# Patient Record
Sex: Female | Born: 1990 | Hispanic: Yes | Marital: Married | Smoking: Never smoker
Health system: Southern US, Community
[De-identification: ages and names within clinical notes are randomized; demographics above are authoritative.]

## PROBLEM LIST (undated history)

## (undated) DIAGNOSIS — T7840XA Allergy, unspecified, initial encounter: Secondary | ICD-10-CM

## (undated) DIAGNOSIS — D649 Anemia, unspecified: Secondary | ICD-10-CM

## (undated) HISTORY — DX: Anemia, unspecified: D64.9

## (undated) HISTORY — PX: NO PAST SURGERIES: SHX2092

## (undated) HISTORY — DX: Allergy, unspecified, initial encounter: T78.40XA

## (undated) HISTORY — PX: OTHER SURGICAL HISTORY: SHX169

---
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
## 2012-04-23 ENCOUNTER — Emergency Department: Payer: Self-pay | Admitting: Emergency Medicine

## 2012-09-06 ENCOUNTER — Emergency Department: Payer: Self-pay | Admitting: Emergency Medicine

## 2012-09-09 LAB — BETA STREP CULTURE(ARMC)

## 2013-01-25 ENCOUNTER — Emergency Department: Payer: Self-pay | Admitting: Emergency Medicine

## 2013-01-25 LAB — URINALYSIS, COMPLETE
Bacteria: NONE SEEN
Bilirubin,UR: NEGATIVE
Blood: NEGATIVE
Ketone: NEGATIVE
Nitrite: NEGATIVE
Ph: 6 (ref 4.5–8.0)
Specific Gravity: 1.005 (ref 1.003–1.030)
Squamous Epithelial: 4
WBC UR: 9 /HPF (ref 0–5)

## 2013-01-25 LAB — CBC
HCT: 35.6 % (ref 35.0–47.0)
MCH: 26 pg (ref 26.0–34.0)
MCHC: 32.4 g/dL (ref 32.0–36.0)
Platelet: 252 10*3/uL (ref 150–440)
RBC: 4.45 10*6/uL (ref 3.80–5.20)
RDW: 15 % — ABNORMAL HIGH (ref 11.5–14.5)
WBC: 8.9 10*3/uL (ref 3.6–11.0)

## 2013-01-25 LAB — COMPREHENSIVE METABOLIC PANEL
Alkaline Phosphatase: 67 U/L (ref 50–136)
Anion Gap: 8 (ref 7–16)
Bilirubin,Total: 1.6 mg/dL — ABNORMAL HIGH (ref 0.2–1.0)
Calcium, Total: 9.5 mg/dL (ref 8.5–10.1)
EGFR (Non-African Amer.): >60
Glucose: 92 mg/dL (ref 65–99)
Osmolality: 270 (ref 275–301)
Potassium: 3.4 mmol/L — ABNORMAL LOW (ref 3.5–5.1)
SGPT (ALT): 16 U/L (ref 12–78)

## 2013-01-25 LAB — LIPASE, BLOOD: Lipase: 137 U/L (ref 73–393)

## 2013-09-01 ENCOUNTER — Emergency Department: Payer: Self-pay | Admitting: Emergency Medicine

## 2013-09-01 LAB — COMPREHENSIVE METABOLIC PANEL
ALT: 18 U/L (ref 12–78)
ANION GAP: 3 — AB (ref 7–16)
Albumin: 4 g/dL (ref 3.4–5.0)
Alkaline Phosphatase: 75 U/L
BUN: 17 mg/dL (ref 7–18)
Bilirubin,Total: 0.7 mg/dL (ref 0.2–1.0)
CO2: 29 mmol/L (ref 21–32)
CREATININE: 0.63 mg/dL (ref 0.60–1.30)
Calcium, Total: 8.7 mg/dL (ref 8.5–10.1)
Chloride: 107 mmol/L (ref 98–107)
EGFR (African American): >60
EGFR (Non-African Amer.): >60
Glucose: 97 mg/dL (ref 65–99)
Osmolality: 279 (ref 275–301)
Potassium: 3.8 mmol/L (ref 3.5–5.1)
SGOT(AST): 18 U/L (ref 15–37)
Sodium: 139 mmol/L (ref 136–145)
TOTAL PROTEIN: 8.6 g/dL — AB (ref 6.4–8.2)

## 2013-09-01 LAB — URINALYSIS, COMPLETE
BACTERIA: NONE SEEN
BLOOD: NEGATIVE
Bilirubin,UR: NEGATIVE
Glucose,UR: NEGATIVE mg/dL (ref 0–75)
KETONE: NEGATIVE
NITRITE: NEGATIVE
PH: 6 (ref 4.5–8.0)
Protein: NEGATIVE
RBC, UR: NONE SEEN /HPF (ref 0–5)
Specific Gravity: 1.009 (ref 1.003–1.030)
Squamous Epithelial: 1
WBC UR: 1 /HPF (ref 0–5)

## 2013-09-01 LAB — CBC WITH DIFFERENTIAL/PLATELET
BASOS ABS: 0 10*3/uL (ref 0.0–0.1)
BASOS PCT: 0.8 %
Eosinophil #: 0.1 10*3/uL (ref 0.0–0.7)
Eosinophil %: 2.1 %
HCT: 34.4 % — ABNORMAL LOW (ref 35.0–47.0)
HGB: 10.8 g/dL — ABNORMAL LOW (ref 12.0–16.0)
LYMPHS ABS: 2.8 10*3/uL (ref 1.0–3.6)
Lymphocyte %: 50.8 %
MCH: 25.6 pg — AB (ref 26.0–34.0)
MCHC: 31.3 g/dL — ABNORMAL LOW (ref 32.0–36.0)
MCV: 82 fL (ref 80–100)
Monocyte #: 0.5 x10 3/mm (ref 0.2–0.9)
Monocyte %: 8.3 %
Neutrophil #: 2.1 10*3/uL (ref 1.4–6.5)
Neutrophil %: 38 %
Platelet: 199 10*3/uL (ref 150–440)
RBC: 4.21 10*6/uL (ref 3.80–5.20)
RDW: 15.4 % — ABNORMAL HIGH (ref 11.5–14.5)
WBC: 5.5 10*3/uL (ref 3.6–11.0)

## 2013-09-01 LAB — LIPASE, BLOOD: LIPASE: 207 U/L (ref 73–393)

## 2013-09-02 LAB — GC/CHLAMYDIA PROBE AMP

## 2013-09-02 LAB — WET PREP, GENITAL

## 2013-09-07 ENCOUNTER — Emergency Department: Payer: Self-pay | Admitting: Emergency Medicine

## 2014-01-29 IMAGING — US US PELV - US TRANSVAGINAL
1 series · 14 of 25 positions shown · non-contrast
Comparison: none

REASON FOR EXAM: concern for mass
COMMENTS:

[Series 1: us pelv - us transvaginal · 0.21mm/px · 14 of 45 slices shown]
[im 1/45]
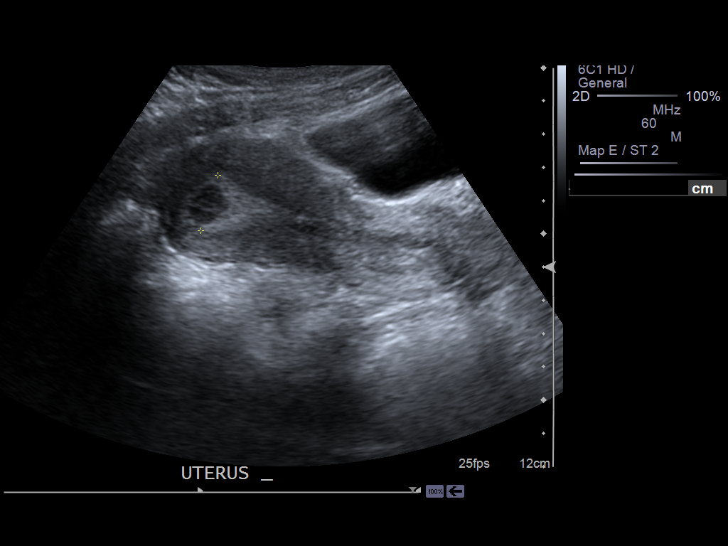
[im 4/45]
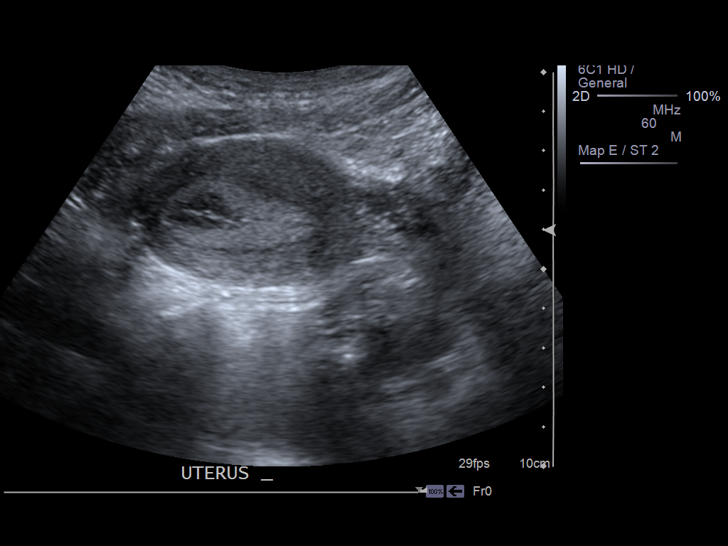
[im 8/45]
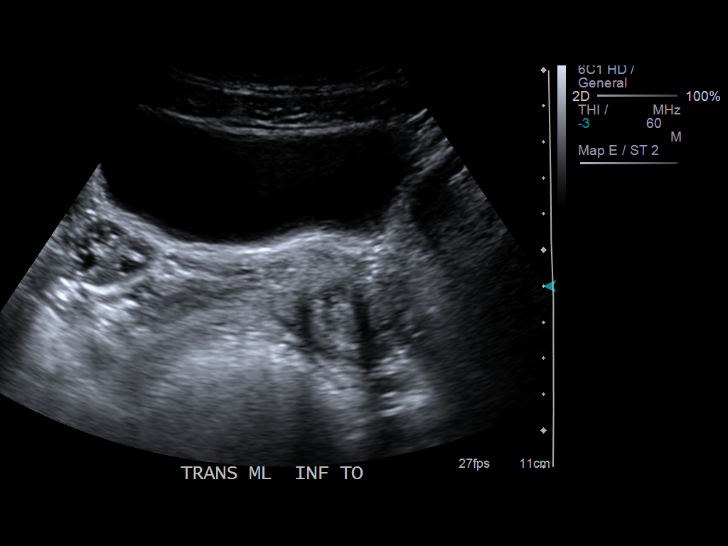
[im 12/45]
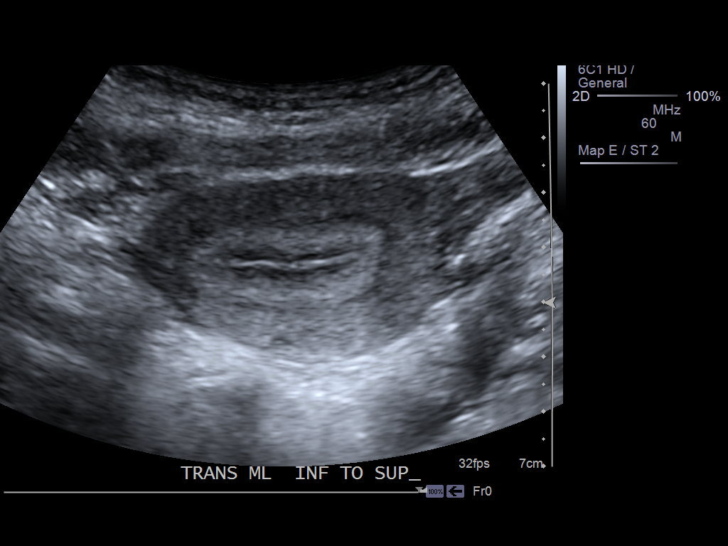
[im 15/45]
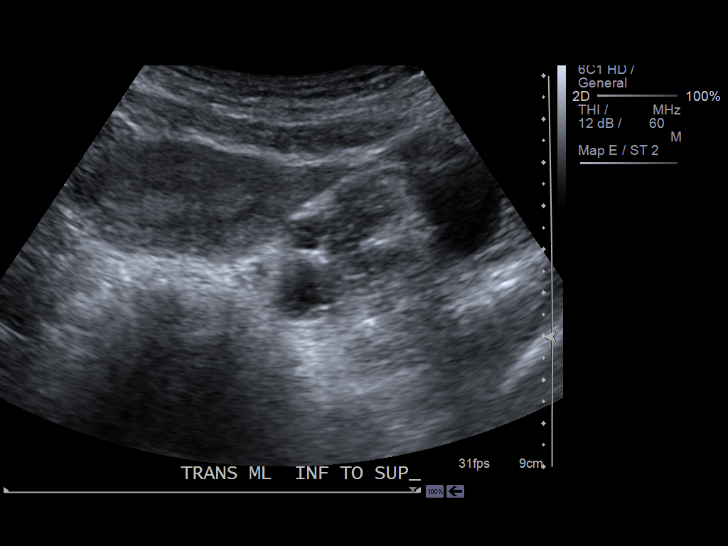
[im 17/45]
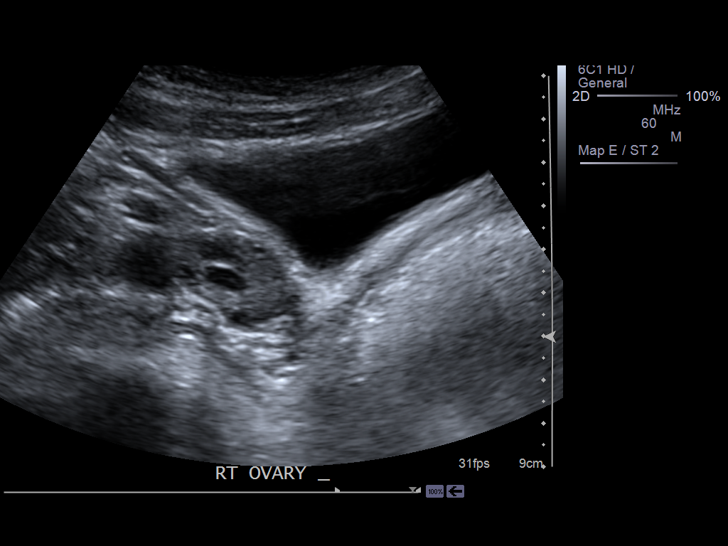
[im 21/45]
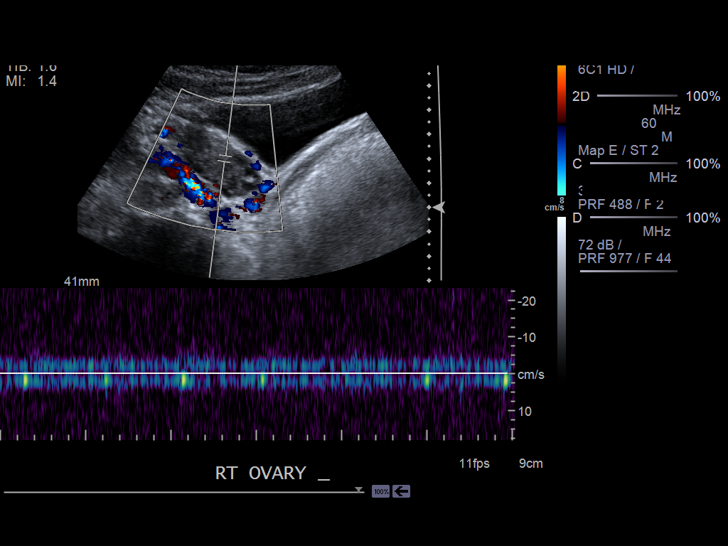
[im 24/45]
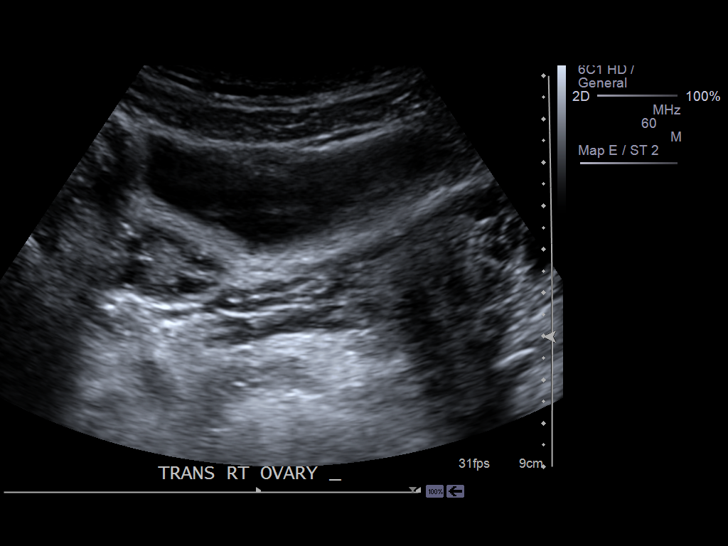
[im 28/45]
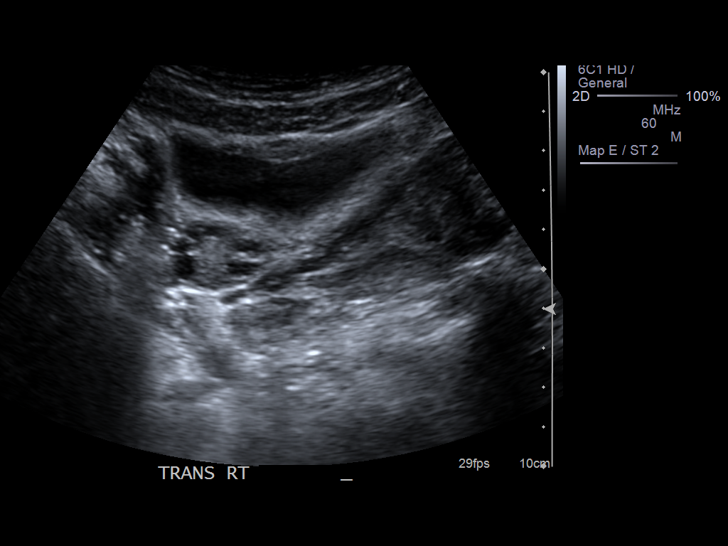
[im 30/45]
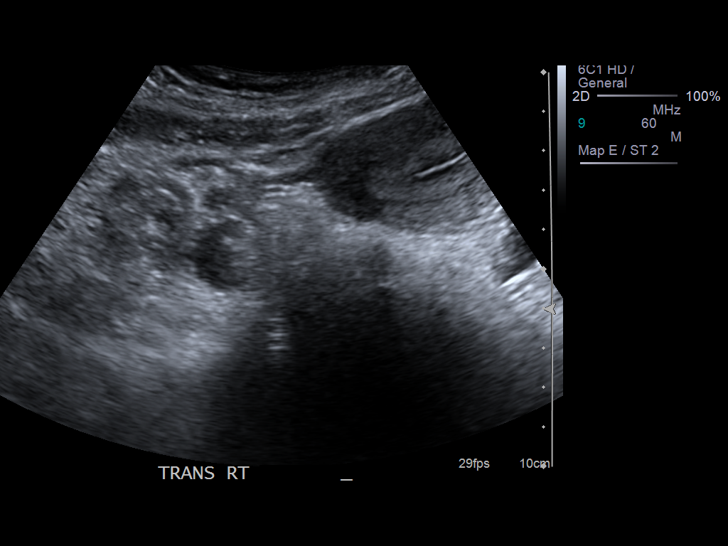
[im 34/45]
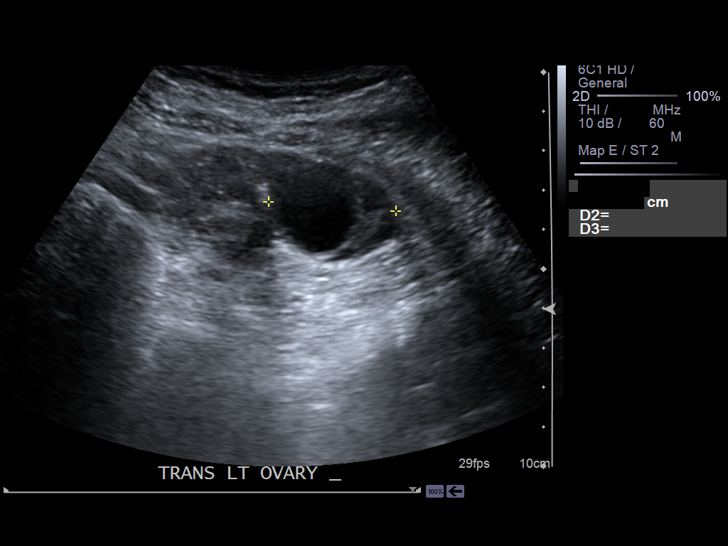
[im 37/45]
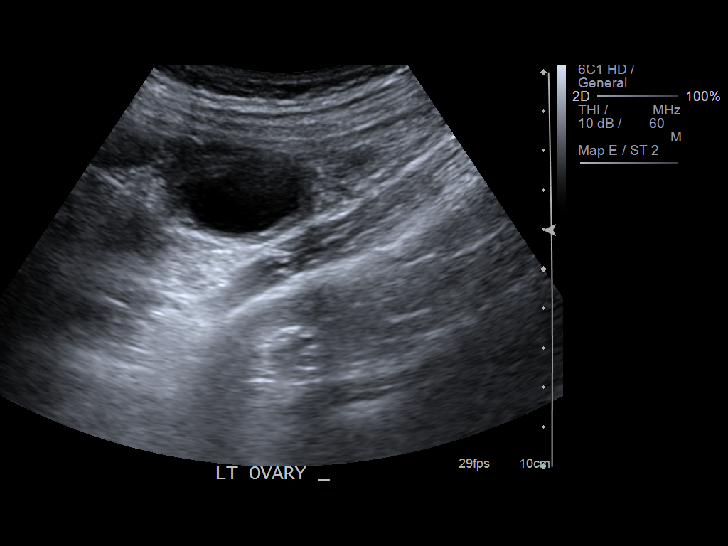
[im 41/45]
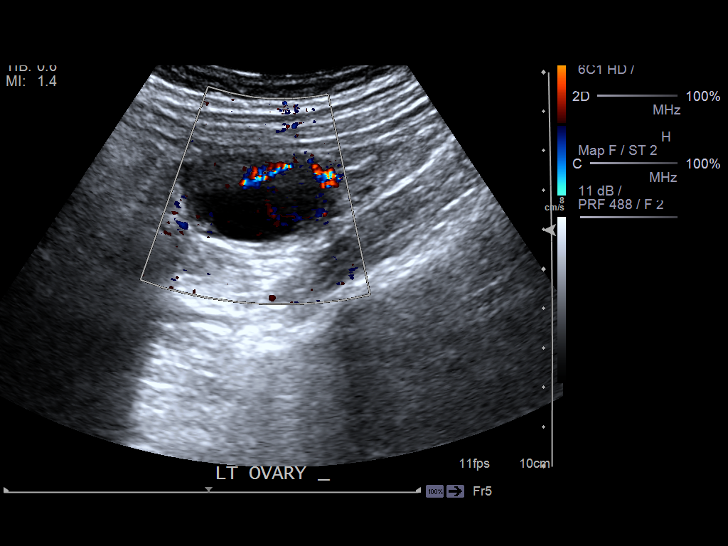
[im 45/45]
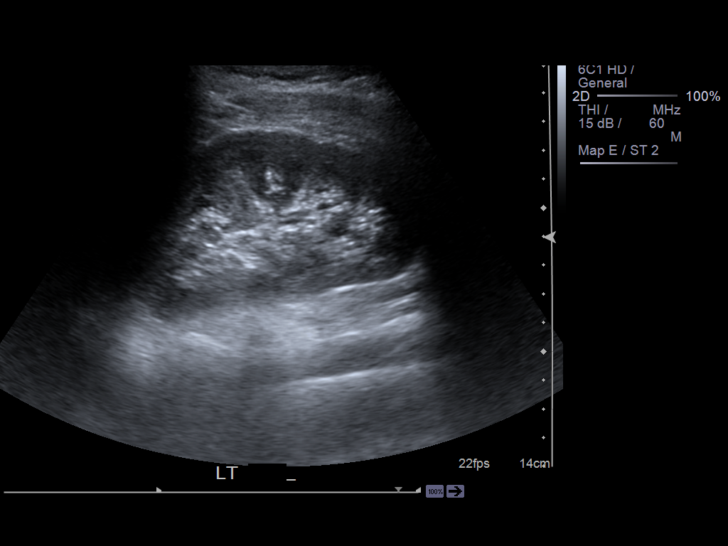

[14 of 25 positions shown; findings below may reference images not displayed]

PROCEDURE:     US  - US PELVIS EXAM W/TRANSVAGINAL  - January 26, 2013  [DATE]

RESULT:     Grayscale and color flow Doppler techniques were employed to
evaluate the pelvic structures.

The uterus is normal in contour and measures 9.3 x 5.3 x 4 cm. The
endometrial stripe is thickened at 17.5 mm. The patient is about to begin
her menstrual cycle by history. There is no free fluid in the cul-de-sac.

The right ovary exhibits normal echotexture and vascularity and measures 3 x
1.9 x 1.6 cm. The left ovary measures 4 x 3.2 x 2.5 cm and contains a 3 x
1.9 x 2 cm diameter simple appearing cyst. Vascularity of the left ovary
appears normal. Survey views of the kidneys are normal.
IMPRESSION: 1. There is a simple appearing cyst associated with the left ovary. The
right ovary is normal in appearance.
2. The uterus is normal in appearance for age.
3. No adnexal mass is demonstrated.

A preliminary report was sent to the [HOSPITAL] the conclusion
of the study.

[REDACTED]

## 2014-03-15 DIAGNOSIS — L709 Acne, unspecified: Secondary | ICD-10-CM | POA: Insufficient documentation

## 2014-03-15 DIAGNOSIS — L219 Seborrheic dermatitis, unspecified: Secondary | ICD-10-CM | POA: Insufficient documentation

## 2016-06-05 NOTE — L&D Delivery Note (Signed)
      Delivery Note   Kristine King is a 26 y.o. G2P1001 at 2213w2d Estimated Date of Delivery: 02/14/17  PRE-OPERATIVE DIAGNOSIS:  1) 5913w2d pregnancy.   POST-OPERATIVE DIAGNOSIS:  1) 9313w2d pregnancy s/p Vaginal, Spontaneous Delivery   Delivery Type: Vaginal, Spontaneous Delivery    Delivery Clinician: Doreene BurkeHOMPSON, Derryl Uher   Delivery Anesthesia: None   Labor Complications:     Additional complications: none  ESTIMATED BLOOD LOSS: 300mL   FINDINGS:   1) female infant, Apgar scores of 8    at 1 minute and 9    at 5 minutes and a birthweight of    ounces.    2) Nuchal cord: No  SPECIMENS:   PLACENTA:   Appearance: Intact , 3 vessel cord, cord blood collected   Removal: Spontaneous      Disposition:    held per protocol then discarded  DISPOSITION:  Infant to left in stable condition in the delivery room, with L&D personnel and mother,  NARRATIVE SUMMARY: Labor course:  Ms. Kristine SensorMagdalena Buccieri is a G2P1001 at 2913w2d who presented for labor management.  She progressed well in labor without pitocin.  She received the appropriate anesthesia and proceeded to complete dilation. She evidenced good maternal expulsive effort during the second stage. She went on to deliver a viable female infant. The placenta delivered without problems and was noted to be complete. A perineal and vaginal examination was performed. Episiotomy/Lacerations:   none The patient tolerated this well.  Doreene Burkennie Emanuell Morina, CNM 02/02/2017 8:47 AM

## 2016-06-16 ENCOUNTER — Encounter: Payer: Self-pay | Admitting: Certified Nurse Midwife

## 2016-06-16 ENCOUNTER — Ambulatory Visit (INDEPENDENT_AMBULATORY_CARE_PROVIDER_SITE_OTHER): Payer: 59 | Admitting: Certified Nurse Midwife

## 2016-06-16 VITALS — BP 105/60 | HR 76 | Ht 62.0 in | Wt 136.1 lb

## 2016-06-16 DIAGNOSIS — Z3201 Encounter for pregnancy test, result positive: Secondary | ICD-10-CM

## 2016-06-16 DIAGNOSIS — N926 Irregular menstruation, unspecified: Secondary | ICD-10-CM | POA: Diagnosis not present

## 2016-06-16 LAB — POCT URINE PREGNANCY: Preg Test, Ur: POSITIVE — AB

## 2016-06-16 NOTE — Patient Instructions (Signed)

## 2016-06-16 NOTE — Progress Notes (Signed)
Pt is here for a confirmation of pregnancy. 3 positive home tests. Period in Nov was short and light. Oct was normal. Nausea,vomiting,fatigue and breast tenderness.

## 2016-06-16 NOTE — Progress Notes (Signed)
GYN ENCOUNTER NOTE  Subjective:       Kristine King is a 26 y.o. female is here for gynecologic evaluation of the following issues: missed menses and positive pregnancy test. Previously a patient of [facility].   [name] reports three (3) positive urine pregnancy tests on 05/09/2016.  Her menstrual periods occur monthly, but not at the same time each moth. Her last cycle after Thanksgiving was short and lighter than usual.  She endorses breast tenderness, nausea, and sensitivity to smells as well as itching and dryness to arms and abdomen.   She denies difficulty breathing or respiratory distress, chest pain, abdominal pain, vaginal bleeding, and leg pain or swelling.    Gynecologic History Patient's last menstrual period was 05/01/2016 (approximate).   EDD: 02/05/2017  Gestational age: 42 weeks 4 days  Contraception: none   Last Pap: 2011. Results were: normal  Obstetric History OB History  Gravida Para Term Preterm AB Living  2 1 1     1   SAB TAB Ectopic Multiple Live Births          1    # Outcome Date GA Lbr Len/2nd Weight Sex Delivery Anes PTL Lv  2 Current           1 Term 12/16/09   6 lb 10 oz (3.005 kg) F Vag-Spont  N LIV      Past Medical History:  Diagnosis Date  . Anemia     History reviewed. No pertinent surgical history.  No current outpatient prescriptions on file prior to visit.   No current facility-administered medications on file prior to visit.     Allergies  Allergen Reactions  . Meloxicam Swelling  . Penicillin G Hives    Social History   Social History  . Marital status: Married    Spouse name: N/A  . Number of children: N/A  . Years of education: N/A   Occupational History  . Not on file.   Social History Main Topics  . Smoking status: Never Smoker  . Smokeless tobacco: Never Used  . Alcohol use No  . Drug use: No  . Sexual activity: Yes    Birth control/ protection: None   Other Topics Concern  . Not on file    Social History Narrative  . No narrative on file    Family History  Problem Relation Age of Onset  . Diabetes Paternal Grandfather     The following portions of the patient's history were reviewed and updated as appropriate: allergies, current medications, past family history, past medical history, past social history, past surgical history and problem list.  Review of Systems  Review of Systems - Negative except as noted above  Objective:   BP 105/60   Pulse 76   Ht 5\' 2"  (1.575 m)   Wt 136 lb 2 oz (61.7 kg)   LMP 05/01/2016 (Approximate)   BMI 24.90 kg/m    CONSTITUTIONAL: Well-developed, well-nourished female in no acute distress.   HENT:  Normocephalic, atraumatic.   NECK: Normal range of motion, supple, no masses.  Normal thyroid.   SKIN: Skin is warm and dry. No rash noted. Not diaphoretic. No erythema. No pallor.  NEUROLGIC: Alert and oriented to person, place, and time.   PSYCHIATRIC: Normal mood and affect. Normal behavior. Normal judgment and thought content.  CARDIOVASCULAR: Regular rate and rhythm   RESPIRATORY: Clear bilateral  BREASTS: Not Examined  ABDOMEN: Soft, non distended; Non tender.  No Organomegaly.  PELVIC: Not examined.  MUSCULOSKELETAL: Normal range of motion. No tenderness.  No cyanosis, clubbing, or edema.  Labs: positive urine pregnancy test  Assessment:   Missed menses  Positive pregnancy test  Plan:   RTC x 1-2 weeks for viability [place] and nurse intake.  RTC sooner if needed.  Reviewed red flag symptoms and reasons to call.  Start taking PNV w/folic acid and DHA.    Gunnar Bulla, CNM

## 2016-06-30 ENCOUNTER — Ambulatory Visit (INDEPENDENT_AMBULATORY_CARE_PROVIDER_SITE_OTHER): Payer: 59

## 2016-06-30 ENCOUNTER — Ambulatory Visit (INDEPENDENT_AMBULATORY_CARE_PROVIDER_SITE_OTHER): Payer: 59 | Admitting: Certified Nurse Midwife

## 2016-06-30 VITALS — BP 101/58 | HR 66 | Ht 62.0 in | Wt 137.8 lb

## 2016-06-30 DIAGNOSIS — N926 Irregular menstruation, unspecified: Secondary | ICD-10-CM | POA: Diagnosis not present

## 2016-06-30 DIAGNOSIS — Z3481 Encounter for supervision of other normal pregnancy, first trimester: Secondary | ICD-10-CM

## 2016-06-30 DIAGNOSIS — Z1389 Encounter for screening for other disorder: Secondary | ICD-10-CM

## 2016-06-30 DIAGNOSIS — Z113 Encounter for screening for infections with a predominantly sexual mode of transmission: Secondary | ICD-10-CM

## 2016-06-30 DIAGNOSIS — Z3201 Encounter for pregnancy test, result positive: Secondary | ICD-10-CM | POA: Diagnosis not present

## 2016-06-30 NOTE — Patient Instructions (Signed)
Pregnancy and Zika Virus Disease Introduction Zika virus disease, or Zika, is an illness that can spread to people from mosquitoes that carry the virus. It may also spread from person to person through infected body fluids. Zika first occurred in [place], but recently it has spread to new areas. The virus occurs in tropical climates. The location of Zika continues to change. Most people who become infected with Zika virus do not develop serious illness. However, Zika may cause birth defects in an unborn baby whose mother is infected with the virus. It may also increase the risk of miscarriage. What are the symptoms of Zika virus disease? In many cases, people who have been infected with Zika virus do not develop any symptoms. If symptoms appear, they usually start about a week after the person is infected. Symptoms are usually mild. They may include:  Fever.  Rash.  Red eyes.  Joint pain. How does Zika virus disease spread? The main way that Zika virus spreads is through the bite of a certain type of mosquito. Unlike most types of mosquitos, which bite only at night, the type of mosquito that carries Zika virus bites both at night and during the day. Zika virus can also spread through sexual contact, through a blood transfusion, and from a mother to her baby before or during birth. Once you have had Zika virus disease, it is unlikely that you will get it again. Can I pass Zika to my baby during pregnancy? Yes, Zika can pass from a mother to her baby before or during birth. What problems can Zika cause for my baby? A woman who is infected with Zika virus while pregnant is at risk of having her baby born with a condition in which the brain or head is smaller than expected (microcephaly). Babies who have microcephaly can have developmental delays, seizures, hearing problems, and vision problems. Having Zika virus disease during pregnancy can also increase the risk of miscarriage. How can Zika  virus disease be prevented? There is no vaccine to prevent Zika. The best way to prevent the disease is to avoid infected mosquitoes and avoid exposure to body fluids that can spread the virus. Avoid any possible exposure to Zika by taking the following precautions. For women and their sex partners:  Avoid traveling to high-risk areas. The locations where Zika is being reported change often. To identify high-risk areas, check the CDC travel website: www.cdc.gov/zika/geo/index.html  If you or your sex partner must travel to a high-risk area, talk with a health care provider before and after traveling.  Take all precautions to avoid mosquito bites if you live in, or travel to, any of the high-risk areas. Insect repellents are safe to use during pregnancy.  Ask your health care provider when it is safe to have sexual contact. For women:  If you are pregnant or trying to become pregnant, avoid sexual contact with persons who may have been exposed to Zika virus, persons who have possible symptoms of Zika, or persons whose history you are unsure about. If you choose to have sexual contact with someone who may have been exposed to Zika virus, use condoms correctly during the entire duration of sexual activity, every time. Do not share sexual devices, as you may be exposed to body fluids.  Ask your health care provider about when it is safe to attempt pregnancy after a possible exposure to Zika virus. What steps should I take to avoid mosquito bites? Take these steps to avoid mosquito bites when you   are in a high-risk area:  Wear loose clothing that covers your arms and legs.  Limit your outdoor activities.  Do not open windows unless they have window screens.  Sleep under mosquito nets.  Use insect repellent. The best insect repellents have:  DEET, picaridin, oil of lemon eucalyptus (OLE), or IR3535 in them.  Higher amounts of an active ingredient in them.  Remember that insect repellents  are safe to use during pregnancy.  Do not use OLE on children who are younger than 3 years of age. Do not use insect repellent on babies who are younger than 2 months of age.  Cover your child's stroller with mosquito netting. Make sure the netting fits snugly and that any loose netting does not cover your child's mouth or nose. Do not use a blanket as a mosquito-protection cover.  Do not apply insect repellent underneath clothing.  If you are using sunscreen, apply the sunscreen before applying the insect repellent.  Treat clothing with permethrin. Do not apply permethrin directly to your skin. Follow label directions for safe use.  Get rid of standing water, where mosquitoes may reproduce. Standing water is often found in items such as buckets, bowls, animal food dishes, and flowerpots. When you return from traveling to any high-risk area, continue taking actions to protect yourself against mosquito bites for 3 weeks, even if you show no signs of illness. This will prevent spreading Zika virus to uninfected mosquitoes. What should I know about the sexual transmission of Zika? People can spread Zika to their sexual partners during vaginal, anal, or oral sex, or by sharing sexual devices. Many people with Zika do not develop symptoms, so a person could spread the disease without knowing that they are infected. The greatest risk is to women who are pregnant or who may become pregnant. Zika virus can live longer in semen than it can live in blood. Couples can prevent sexual transmission of the virus by:  Using condoms correctly during the entire duration of sexual activity, every time. This includes vaginal, anal, and oral sex.  Not sharing sexual devices. Sharing increases your risk of being exposed to body fluid from another person.  Avoiding all sexual activity until your health care provider says it is safe. Should I be tested for Zika virus? A sample of your blood can be tested for Zika  virus. A pregnant woman should be tested if she may have been exposed to the virus or if she has symptoms of Zika. She may also have additional tests done during her pregnancy, such ultrasound testing. Talk with your health care provider about which tests are recommended. This information is not intended to replace advice given to you by your health care provider. Make sure you discuss any questions you have with your health care provider. Document Released: 02/10/2015 Document Revised: 10/28/2015 Document Reviewed: 02/03/2015  2017 Elsevier Minor Illnesses and Medications in Pregnancy  Cold/Flu:  Sudafed for congestion- Robitussin (plain) for cough- Tylenol for discomfort.  Please follow the directions on the label.  Try not to take any more than needed.  OTC Saline nasal spray and air humidifier or cool-mist  Vaporizer to sooth nasal irritation and to loosen congestion.  It is also important to increase intake of non carbonated fluids, especially if you have a fever.  Constipation:  Colace-2 capsules at bedtime; Metamucil- follow directions on label; Senokot- 1 tablet at bedtime.  Any one of these medications can be used.  It is also very important to increase   fluids and fruits along with regular exercise.  If problem persists please call the office.  Diarrhea:  Kaopectate as directed on the label.  Eat a bland diet and increase fluids.  Avoid highly seasoned foods.  Headache:  Tylenol 1 or 2 tablets every 3-4 hours as needed  Indigestion:  Maalox, Mylanta, Tums or Rolaids- as directed on label.  Also try to eat small meals and avoid fatty, greasy or spicy foods.  Nausea with or without Vomiting:  Nausea in pregnancy is caused by increased levels of hormones in the body which influence the digestive system and cause irritation when stomach acids accumulate.  Symptoms usually subside after 1st trimester of pregnancy.  Try the following: 1. Keep saltines, graham crackers or dry toast by your bed to  eat upon awakening. 2. Don't let your stomach get empty.  Try to eat 5-6 small meals per day instead of 3 large ones. 3. Avoid greasy fatty or highly seasoned foods.  4. Take OTC Unisom 1 tablet at bed time along with OTC Vitamin B6 25-50 mg 3 times per day.    If nausea continues with vomiting and you are unable to keep down food and fluids you may need a prescription medication.  Please notify your provider.   Sore throat:  Chloraseptic spray, throat lozenges and or plain Tylenol.  Vaginal Yeast Infection:  OTC Monistat for 7 days as directed on label.  If symptoms do not resolve within a week notify provider.  If any of the above problems do not subside with recommended treatment please call the office for further assistance.   Do not take Aspirin, Advil, Motrin or Ibuprofen.  * * OTC= Over the counter Hyperemesis Gravidarum Hyperemesis gravidarum is a severe form of nausea and vomiting that happens during pregnancy. Hyperemesis is worse than morning sickness. It may cause you to have nausea or vomiting all day for many days. It may keep you from eating and drinking enough food and liquids. Hyperemesis usually occurs during the first half (the first 20 weeks) of pregnancy. It often goes away once a woman is in her second half of pregnancy. However, sometimes hyperemesis continues through an entire pregnancy. What are the causes? The cause of this condition is not known. It may be related to changes in chemicals (hormones) in the body during pregnancy, such as the high level of pregnancy hormone (human chorionic gonadotropin) or the increase in the female sex hormone (estrogen). What are the signs or symptoms? Symptoms of this condition include:  Severe nausea and vomiting.  Nausea that does not go away.  Vomiting that does not allow you to keep any food down.  Weight loss.  Body fluid loss (dehydration).  Having no desire to eat, or not liking food that you have previously  enjoyed. How is this diagnosed? This condition may be diagnosed based on:  A physical exam.  Your medical history.  Your symptoms.  Blood tests.  Urine tests. How is this treated? This condition may be managed with medicine. If medicines to do not help relieve nausea and vomiting, you may need to receive fluids through an IV tube at the hospital. Follow these instructions at home:  Take over-the-counter and prescription medicines only as told by your health care provider.  Avoid iron pills and multivitamins that contain iron for the first 3-4 months of pregnancy. If you take prescription iron pills, do not stop taking them unless your health care provider approves.  Take the following actions to help   prevent nausea and vomiting:  In the morning, before getting out of bed, try eating a couple of dry crackers or a piece of toast.  Avoid foods and smells that upset your stomach. Fatty and spicy foods may make nausea worse.  Eat 5-6 small meals a day.  Do not drink fluids while eating meals. Drink between meals.  Eat or suck on things that have ginger in them. Ginger can help relieve nausea.  Avoid food preparation. The smell of food can spoil your appetite or trigger nausea.  Follow instructions from your health care provider about eating or drinking restrictions.  For snacks, eat high-protein foods, such as cheese.  Keep all follow-up and pre-birth (prenatal) visits as told by your health care provider. This is important. Contact a health care provider if:  You have pain in your abdomen.  You have a severe headache.  You have vision problems.  You are losing weight. Get help right away if:  You cannot drink fluids without vomiting.  You vomit blood.  You have constant nausea and vomiting.  You are very weak.  You are very thirsty.  You feel dizzy.  You faint.  You have a fever or other symptoms that last for more than 2-3 days.  You have a fever and  your symptoms suddenly get worse. Summary  Hyperemesis gravidarum is a severe form of nausea and vomiting that happens during pregnancy.  Making some changes to your eating habits may help relieve nausea and vomiting.  This condition may be managed with medicine.  If medicines to do not help relieve nausea and vomiting, you may need to receive fluids through an IV tube at the hospital. This information is not intended to replace advice given to you by your health care provider. Make sure you discuss any questions you have with your health care provider. Document Released: 05/22/2005 Document Revised: 01/19/2016 Document Reviewed: 01/19/2016 Elsevier Interactive Patient Education  2017 [employer] First Trimester of Pregnancy The first trimester of pregnancy is from week 1 until the end of week 12 (months 1 through 3). During this time, your baby will begin to develop inside you. At 6-8 weeks, the eyes and face are formed, and the heartbeat can be seen on ultrasound. At the end of 12 weeks, all the baby's organs are formed. Prenatal care is all the medical care you receive before the birth of your baby. Make sure you get good prenatal care and follow all of your doctor's instructions. Follow these instructions at home: Medicines  Take medicine only as told by your doctor. Some medicines are safe and some are not during pregnancy.  Take your prenatal vitamins as told by your doctor.  Take medicine that helps you poop (stool softener) as needed if your doctor says it is okay. Diet  Eat regular, healthy meals.  Your doctor will tell you the amount of weight gain that is right for you.  Avoid raw meat and uncooked cheese.  If you feel sick to your stomach (nauseous) or throw up (vomit):  Eat 4 or 5 small meals a day instead of 3 large meals.  Try eating a few soda crackers.  Drink liquids between meals instead of during meals.  If you have a hard time pooping  (constipation):  Eat high-fiber foods like fresh vegetables, fruit, and whole grains.  Drink enough fluids to keep your pee (urine) clear or pale yellow. Activity and Exercise  Exercise only as told by your doctor. Stop exercising if   you have cramps or pain in your lower belly (abdomen) or low back.  Try to avoid standing for long periods of time. Move your legs often if you must stand in one place for a long time.  Avoid heavy lifting.  Wear low-heeled shoes. Sit and stand up straight.  You can have sex unless your doctor tells you not to. Relief of Pain or Discomfort  Wear a good support bra if your breasts are sore.  Take warm water baths (sitz baths) to soothe pain or discomfort caused by hemorrhoids. Use hemorrhoid cream if your doctor says it is okay.  Rest with your legs raised if you have leg cramps or low back pain.  Wear support hose if you have puffy, bulging veins (varicose veins) in your legs. Raise (elevate) your feet for 15 minutes, 3-4 times a day. Limit salt in your diet. Prenatal Care  Schedule your prenatal visits by the twelfth week of pregnancy.  Write down your questions. Take them to your prenatal visits.  Keep all your prenatal visits as told by your doctor. Safety  Wear your seat belt at all times when driving.  Make a list of emergency phone numbers. The list should include numbers for family, friends, the hospital, and police and fire departments. General Tips  Ask your doctor for a referral to a local prenatal class. Begin classes no later than at the start of month 6 of your pregnancy.  Ask for help if you need counseling or help with nutrition. Your doctor can give you advice or tell you where to go for help.  Do not use hot tubs, steam rooms, or saunas.  Do not douche or use tampons or scented sanitary pads.  Do not cross your legs for long periods of time.  Avoid litter boxes and soil used by cats.  Avoid all smoking, herbs, and  alcohol. Avoid drugs not approved by your doctor.  Do not use any tobacco products, including cigarettes, chewing tobacco, and electronic cigarettes. If you need help quitting, ask your doctor. You may get counseling or other support to help you quit.  Visit your dentist. At home, brush your teeth with a soft toothbrush. Be gentle when you floss. Get help if:  You are dizzy.  You have mild cramps or pressure in your lower belly.  You have a nagging pain in your belly area.  You continue to feel sick to your stomach, throw up, or have watery poop (diarrhea).  You have a bad smelling fluid coming from your vagina.  You have pain with peeing (urination).  You have increased puffiness (swelling) in your face, hands, legs, or ankles. Get help right away if:  You have a fever.  You are leaking fluid from your vagina.  You have spotting or bleeding from your vagina.  You have very bad belly cramping or pain.  You gain or lose weight rapidly.  You throw up blood. It may look like coffee grounds.  You are around people who have German measles, fifth disease, or chickenpox.  You have a very bad headache.  You have shortness of breath.  You have any kind of trauma, such as from a fall or a car accident. This information is not intended to replace advice given to you by your health care provider. Make sure you discuss any questions you have with your health care provider. Document Released: 11/08/2007 Document Revised: 10/28/2015 Document Reviewed: 04/01/2013 Elsevier Interactive Patient Education  2017 [employer]. Commonly Asked Questions   During Pregnancy  Cats: A parasite can be excreted in cat feces.  To avoid exposure you need to have another person empty the little box.  If you must empty the litter box you will need to wear gloves.  Wash your hands after handling your cat.  This parasite can also be found in raw or undercooked meat so this should also be avoided.  Colds,  Sore Throats, Flu: Please check your medication sheet to see what you can take for symptoms.  If your symptoms are unrelieved by these medications please call the office.  Dental Work: Most any dental work your dentist recommends is permitted.  X-rays should only be taken during the first trimester if absolutely necessary.  Your abdomen should be shielded with a lead apron during all x-rays.  Please notify your provider prior to receiving any x-rays.  Novocaine is fine; gas is not recommended.  If your dentist requires a note from us prior to dental work please call the office and we will provide one for you.  Exercise: Exercise is an important part of staying healthy during your pregnancy.  You may continue most exercises you were accustomed to prior to pregnancy.  Later in your pregnancy you will most likely notice you have difficulty with activities requiring balance like riding a bicycle.  It is important that you listen to your body and avoid activities that put you at a higher risk of falling.  Adequate rest and staying well hydrated are a must!  If you have questions about the safety of specific activities ask your provider.    Exposure to Children with illness: Try to avoid obvious exposure; report any symptoms to us when noted,  If you have chicken pos, red measles or mumps, you should be immune to these diseases.   Please do not take any vaccines while pregnant unless you have checked with your OB provider.  Fetal Movement: After 28 weeks we recommend you do "kick counts" twice daily.  Lie or sit down in a calm quiet environment and count your baby movements "kicks".  You should feel your baby at least 10 times per hour.  If you have not felt 10 kicks within the first hour get up, walk around and have something sweet to eat or drink then repeat for an additional hour.  If count remains less than 10 per hour notify your provider.  Fumigating: Follow your pest control agent's advice as to how long  to stay out of your home.  Ventilate the area well before re-entering.  Hemorrhoids:   Most over-the-counter preparations can be used during pregnancy.  Check your medication to see what is safe to use.  It is important to use a stool softener or fiber in your diet and to drink lots of liquids.  If hemorrhoids seem to be getting worse please call the office.   Hot Tubs:  Hot tubs Jacuzzis and saunas are not recommended while pregnant.  These increase your internal body temperature and should be avoided.  Intercourse:  Sexual intercourse is safe during pregnancy as long as you are comfortable, unless otherwise advised by your provider.  Spotting may occur after intercourse; report any bright red bleeding that is heavier than spotting.  Labor:  If you know that you are in labor, please go to the hospital.  If you are unsure, please call the office and let us help you decide what to do.  Lifting, straining, etc:  If your job requires heavy lifting or   straining please check with your provider for any limitations.  Generally, you should not lift items heavier than that you can lift simply with your hands and arms (no back muscles)  Painting:  Paint fumes do not harm your pregnancy, but may make you ill and should be avoided if possible.  Latex or water based paints have less odor than oils.  Use adequate ventilation while painting.  Permanents & Hair Color:  Chemicals in hair dyes are not recommended as they cause increase hair dryness which can increase hair loss during pregnancy.  " Highlighting" and permanents are allowed.  Dye may be absorbed differently and permanents may not hold as well during pregnancy.  Sunbathing:  Use a sunscreen, as skin burns easily during pregnancy.  Drink plenty of fluids; avoid over heating.  Tanning Beds:  Because their possible side effects are still unknown, tanning beds are not recommended.  Ultrasound Scans:  Routine ultrasounds are performed at approximately 20  weeks.  You will be able to see your baby's general anatomy an if you would like to know the gender this can usually be determined as well.  If it is questionable when you conceived you may also receive an ultrasound early in your pregnancy for dating purposes.  Otherwise ultrasound exams are not routinely performed unless there is a medical necessity.  Although you can request a scan we ask that you pay for it when conducted because insurance does not cover " patient request" scans.  Work: If your pregnancy proceeds without complications you may work until your due date, unless your physician or employer advises otherwise.  Round Ligament Pain/Pelvic Discomfort:  Sharp, shooting pains not associated with bleeding are fairly common, usually occurring in the second trimester of pregnancy.  They tend to be worse when standing up or when you remain standing for long periods of time.  These are the result of pressure of certain pelvic ligaments called "round ligaments".  Rest, Tylenol and heat seem to be the most effective relief.  As the womb and fetus grow, they rise out of the pelvis and the discomfort improves.  Please notify the office if your pain seems different than that described.  It may represent a more serious condition.   

## 2016-06-30 NOTE — Progress Notes (Signed)
[facility]. Pregnancy confirmation done on 06/16/16 by JML, UPT: positive.  G-2  P-1001. LMP: 05/01/2016. Ultrasound done today and EDD now 02/15/2017. Difference of +1wk 3d.   Pregnancy education material explained and given. No cats in the home. NOB labs ordered.  HIV labs and Drug screen were explained optional and she did not decline. Drug screen ordered. PNV encouraged. FOB-mother had a stillborn and pt's paternal cousin has mental retardation. Genetic screening, will do the MaterniT and will call for appt about 9.5 wks to have this done or wait until her NOB appt.  Pt. To follow up with provider in _4_ weeks for NOB physical.  All questions answered.

## 2016-07-01 LAB — CBC WITH DIFFERENTIAL/PLATELET
BASOS: 0 %
Basophils Absolute: 0 10*3/uL (ref 0.0–0.2)
EOS (ABSOLUTE): 0.1 10*3/uL (ref 0.0–0.4)
EOS: 1 %
HEMATOCRIT: 36.1 % (ref 34.0–46.6)
Hemoglobin: 11.4 g/dL (ref 11.1–15.9)
Immature Grans (Abs): 0 10*3/uL (ref 0.0–0.1)
Immature Granulocytes: 0 %
LYMPHS ABS: 1.3 10*3/uL (ref 0.7–3.1)
Lymphs: 24 %
MCH: 24.7 pg — AB (ref 26.6–33.0)
MCHC: 31.6 g/dL (ref 31.5–35.7)
MCV: 78 fL — AB (ref 79–97)
MONOCYTES: 10 %
MONOS ABS: 0.6 10*3/uL (ref 0.1–0.9)
NEUTROS ABS: 3.6 10*3/uL (ref 1.4–7.0)
Neutrophils: 65 %
Platelets: 233 10*3/uL (ref 150–379)
RBC: 4.61 x10E6/uL (ref 3.77–5.28)
RDW: 16.5 % — ABNORMAL HIGH (ref 12.3–15.4)
WBC: 5.6 10*3/uL (ref 3.4–10.8)

## 2016-07-01 LAB — RUBELLA SCREEN: RUBELLA: 2.82 {index} (ref >0.99)

## 2016-07-01 LAB — VARICELLA ZOSTER ANTIBODY, IGG: Varicella zoster IgG: 2226 index (ref >165)

## 2016-07-01 LAB — RPR: RPR: NONREACTIVE

## 2016-07-01 LAB — ABO

## 2016-07-01 LAB — ANTIBODY SCREEN: ANTIBODY SCREEN: NEGATIVE

## 2016-07-01 LAB — HEPATITIS B SURFACE ANTIGEN: Hepatitis B Surface Ag: NEGATIVE

## 2016-07-01 LAB — HIV ANTIBODY (ROUTINE TESTING W REFLEX): HIV SCREEN 4TH GENERATION: NONREACTIVE

## 2016-07-01 LAB — RH TYPE: Rh Factor: POSITIVE

## 2016-07-02 LAB — MONITOR DRUG PROFILE 14(MW)
Amphetamine Scrn, Ur: NEGATIVE ng/mL
BARBITURATE SCREEN URINE: NEGATIVE ng/mL
BENZODIAZEPINE SCREEN, URINE: NEGATIVE ng/mL
BUPRENORPHINE, URINE: NEGATIVE ng/mL
CANNABINOIDS UR QL SCN: NEGATIVE ng/mL
COCAINE(METAB.)SCREEN, URINE: NEGATIVE ng/mL
Creatinine(Crt), U: 81.3 mg/dL (ref 20.0–300.0)
Fentanyl, Urine: NEGATIVE pg/mL
MEPERIDINE SCREEN, URINE: NEGATIVE ng/mL
Methadone Screen, Urine: NEGATIVE ng/mL
OPIATE SCREEN URINE: NEGATIVE ng/mL
OXYCODONE+OXYMORPHONE UR QL SCN: NEGATIVE ng/mL
PHENCYCLIDINE QUANTITATIVE URINE: NEGATIVE ng/mL
Ph of Urine: 6 (ref 4.5–8.9)
Propoxyphene Scrn, Ur: NEGATIVE ng/mL
SPECIFIC GRAVITY: 1.028
TRAMADOL SCREEN, URINE: NEGATIVE ng/mL

## 2016-07-02 LAB — URINALYSIS, ROUTINE W REFLEX MICROSCOPIC
Bilirubin, UA: NEGATIVE
Glucose, UA: NEGATIVE
KETONES UA: NEGATIVE
LEUKOCYTES UA: NEGATIVE
Nitrite, UA: NEGATIVE
PROTEIN UA: NEGATIVE
RBC, UA: NEGATIVE
Specific Gravity, UA: 1.023 (ref 1.005–1.030)
Urobilinogen, Ur: 0.2 mg/dL (ref 0.2–1.0)
pH, UA: 6 (ref 5.0–7.5)

## 2016-07-02 LAB — URINE CULTURE, OB REFLEX: Organism ID, Bacteria: NO GROWTH

## 2016-07-02 LAB — NICOTINE SCREEN, URINE: COTININE UR QL SCN: NEGATIVE ng/mL

## 2016-07-02 LAB — CULTURE, OB URINE

## 2016-07-04 LAB — GC/CHLAMYDIA PROBE AMP
Chlamydia trachomatis, NAA: NEGATIVE
Neisseria gonorrhoeae by PCR: NEGATIVE

## 2016-07-11 ENCOUNTER — Encounter: Payer: Self-pay | Admitting: Certified Nurse Midwife

## 2016-07-28 ENCOUNTER — Other Ambulatory Visit: Payer: Self-pay | Admitting: Certified Nurse Midwife

## 2016-07-28 ENCOUNTER — Ambulatory Visit (INDEPENDENT_AMBULATORY_CARE_PROVIDER_SITE_OTHER): Payer: 59 | Admitting: Certified Nurse Midwife

## 2016-07-28 ENCOUNTER — Encounter: Payer: Self-pay | Admitting: Certified Nurse Midwife

## 2016-07-28 VITALS — BP 90/47 | HR 73 | Wt 137.3 lb

## 2016-07-28 DIAGNOSIS — Z1379 Encounter for other screening for genetic and chromosomal anomalies: Secondary | ICD-10-CM

## 2016-07-28 DIAGNOSIS — Z3491 Encounter for supervision of normal pregnancy, unspecified, first trimester: Secondary | ICD-10-CM

## 2016-07-28 LAB — POCT URINALYSIS DIPSTICK
Bilirubin, UA: NEGATIVE
GLUCOSE UA: NEGATIVE
Ketones, UA: NEGATIVE
NITRITE UA: NEGATIVE
PROTEIN UA: NEGATIVE
SPEC GRAV UA: 1.025
UROBILINOGEN UA: NEGATIVE
pH, UA: 5

## 2016-07-28 NOTE — Progress Notes (Signed)
NEW OB HISTORY AND PHYSICAL  SUBJECTIVE:       Kristine King is a 26 y.o. [id number] female, Patient's last menstrual period was 05/01/2016 (approximate)., Estimated Date of Delivery: 02/15/17, [id number], presents today for establishment of Prenatal Care.  She reports two episodes of brown vaginal discharge with wiping last week. She endorses nausea without vomiting, nipple dryness, and breast tenderness.   She reports a non productive cough since Tuesday.   Denies difficulty breathing or respiratory distress, chest pain, abdominal pain, and leg pain or swelling.   She works at [employer] and desires genetic screening, but does not want to know gender of baby.  Seward GraterMaggie has a trip to [place] planned for July 2018. Discussed travel in pregnancy and risk of Zika Virus Disease.    Gynecologic History  Patient's last menstrual period was 05/01/2016 (approximate).   Last Pap: 2015. Results were: normal  Obstetric History OB History  Gravida Para Term Preterm AB Living  2 1 1     1   SAB TAB Ectopic Multiple Live Births          1    # Outcome Date GA Lbr Len/2nd Weight Sex Delivery Anes PTL Lv  2 Current           1 Term 12/16/09   6 lb 10 oz (3.005 kg) F Vag-Spont  N LIV      Past Medical History:  Diagnosis Date  . Anemia     Past Surgical History:  Procedure Laterality Date  . none      Current Outpatient Prescriptions on File Prior to Visit  Medication Sig Dispense Refill  . Prenatal Vit-Fe Fumarate-FA (PRENATAL MULTIVITAMIN) TABS tablet Take 1 tablet by mouth daily at 12 noon.     No current facility-administered medications on file prior to visit.     Allergies  Allergen Reactions  . Meloxicam Swelling  . Penicillin G Hives    Social History   Social History  . Marital status: Married    Spouse name: N/A  . Number of children: N/A  . Years of education: N/A   Occupational History  . Not on file.   Social History Main Topics  . Smoking status: Never Smoker   . Smokeless tobacco: Never Used  . Alcohol use No  . Drug use: No  . Sexual activity: Yes    Partners: Male    Birth control/ protection: None   Other Topics Concern  . Not on file   Social History Narrative  . No narrative on file    Family History  Problem Relation Age of Onset  . Diabetes Paternal Grandfather     The following portions of the patient's history were reviewed and updated as appropriate: allergies, current medications, past OB history, past medical history, past surgical history, past family history, past social history, and problem list.    OBJECTIVE: Initial Physical Exam (New OB)  GENERAL APPEARANCE: alert, well appearing, in no apparent distress  HEAD: normocephalic, atraumatic  MOUTH: mucous membranes moist, pharynx normal without lesions and dental hygiene good  THYROID: not examined  BREASTS: no masses noted, no significant tenderness, no palpable axillary nodes, no skin changes  LUNGS: clear to auscultation, no wheezes, rales or rhonchi, symmetric air entry  HEART: regular rate and rhythm, no murmurs  ABDOMEN: soft, nontender, nondistended, no abnormal masses, no epigastric pain, fundus soft, nontender 11 weeks size and FHT present  EXTREMITIES: no redness or tenderness in the calves or thighs, no edema  SKIN: normal coloration and turgor, no rashes  LYMPH NODES: no adenopathy palpable  NEUROLOGIC: alert, oriented, normal speech, no focal findings or movement disorder noted  PELVIC EXAM EXTERNAL GENITALIA: normal appearing vulva with no masses, tenderness or lesions VAGINA: no abnormal discharge or lesions CERVIX: no lesions or cervical motion tenderness UTERUS: gravid and consistent with 11 weeks ADNEXA: no masses palpable and nontender  ASSESSMENT: Normal pregnancy Desires genetic testing  PLAN: Prenatal care PAP informaSeq without gender today Reviewed pregnancy safe medications Discussed red flag symptoms and when to  call RTC x 4 weeks See orders   Gunnar Bulla, CNM

## 2016-07-28 NOTE — Patient Instructions (Addendum)
Pregnancy and Zika Virus Disease Introduction Zika virus disease, or Zika, is an illness that can spread to people from mosquitoes that carry the virus. It may also spread from person to person through infected body fluids. Zika first occurred in [place], but recently it has spread to new areas. The virus occurs in tropical climates. The location of Zika continues to change. Most people who become infected with Zika virus do not develop serious illness. However, Zika may cause birth defects in an unborn baby whose mother is infected with the virus. It may also increase the risk of miscarriage. What are the symptoms of Zika virus disease? In many cases, people who have been infected with Zika virus do not develop any symptoms. If symptoms appear, they usually start about a week after the person is infected. Symptoms are usually mild. They may include:  Fever.  Rash.  Red eyes.  Joint pain. How does Zika virus disease spread? The main way that Zika virus spreads is through the bite of a certain type of mosquito. Unlike most types of mosquitos, which bite only at night, the type of mosquito that carries Zika virus bites both at night and during the day. Zika virus can also spread through sexual contact, through a blood transfusion, and from a mother to her baby before or during birth. Once you have had Zika virus disease, it is unlikely that you will get it again. Can I pass Zika to my baby during pregnancy? Yes, Zika can pass from a mother to her baby before or during birth. What problems can Zika cause for my baby? A woman who is infected with Zika virus while pregnant is at risk of having her baby born with a condition in which the brain or head is smaller than expected (microcephaly). Babies who have microcephaly can have developmental delays, seizures, hearing problems, and vision problems. Having Zika virus disease during pregnancy can also increase the risk of miscarriage. How can Zika  virus disease be prevented? There is no vaccine to prevent Zika. The best way to prevent the disease is to avoid infected mosquitoes and avoid exposure to body fluids that can spread the virus. Avoid any possible exposure to Zika by taking the following precautions. For women and their sex partners:  Avoid traveling to high-risk areas. The locations where Zika is being reported change often. To identify high-risk areas, check the CDC travel website: www.cdc.gov/zika/geo/index.html  If you or your sex partner must travel to a high-risk area, talk with a health care provider before and after traveling.  Take all precautions to avoid mosquito bites if you live in, or travel to, any of the high-risk areas. Insect repellents are safe to use during pregnancy.  Ask your health care provider when it is safe to have sexual contact. For women:  If you are pregnant or trying to become pregnant, avoid sexual contact with persons who may have been exposed to Zika virus, persons who have possible symptoms of Zika, or persons whose history you are unsure about. If you choose to have sexual contact with someone who may have been exposed to Zika virus, use condoms correctly during the entire duration of sexual activity, every time. Do not share sexual devices, as you may be exposed to body fluids.  Ask your health care provider about when it is safe to attempt pregnancy after a possible exposure to Zika virus. What steps should I take to avoid mosquito bites? Take these steps to avoid mosquito bites when you   are in a high-risk area:  Wear loose clothing that covers your arms and legs.  Limit your outdoor activities.  Do not open windows unless they have window screens.  Sleep under mosquito nets.  Use insect repellent. The best insect repellents have:  DEET, picaridin, oil of lemon eucalyptus (OLE), or IR3535 in them.  Higher amounts of an active ingredient in them.  Remember that insect repellents  are safe to use during pregnancy.  Do not use OLE on children who are younger than 71 years of age. Do not use insect repellent on babies who are younger than 17 months of age.  Cover your child's stroller with mosquito netting. Make sure the netting fits snugly and that any loose netting does not cover your child's mouth or nose. Do not use a blanket as a mosquito-protection cover.  Do not apply insect repellent underneath clothing.  If you are using sunscreen, apply the sunscreen before applying the insect repellent.  Treat clothing with permethrin. Do not apply permethrin directly to your skin. Follow label directions for safe use.  Get rid of standing water, where mosquitoes may reproduce. Standing water is often found in items such as buckets, bowls, animal food dishes, and flowerpots. When you return from traveling to any high-risk area, continue taking actions to protect yourself against mosquito bites for 3 weeks, even if you show no signs of illness. This will prevent spreading Zika virus to uninfected mosquitoes. What should I know about the sexual transmission of Zika? People can spread Zika to their sexual partners during vaginal, anal, or oral sex, or by sharing sexual devices. Many people with [place] do not develop symptoms, so a person could spread the disease without knowing that they are infected. The greatest risk is to women who are pregnant or who may become pregnant. Zika virus can live longer in semen than it can live in blood. Couples can prevent sexual transmission of the virus by:  Using condoms correctly during the entire duration of sexual activity, every time. This includes vaginal, anal, and oral sex.  Not sharing sexual devices. Sharing increases your risk of being exposed to body fluid from another person.  Avoiding all sexual activity until your health care provider says it is safe. Should I be tested for Zika virus? A sample of your blood can be tested for Zika  virus. A pregnant woman should be tested if she may have been exposed to the virus or if she has symptoms of Zika. She may also have additional tests done during her pregnancy, such ultrasound testing. Talk with your health care provider about which tests are recommended. This information is not intended to replace advice given to you by your health care provider. Make sure you discuss any questions you have with your health care provider. Document Released: 02/10/2015 Document Revised: 10/28/2015 Document Reviewed: 02/03/2015  2017 Elsevier   Second Trimester of Pregnancy The second trimester is from week 13 through week 28, month 4 through 6. This is often the time in pregnancy that you feel your best. Often times, morning sickness has lessened or quit. You may have more energy, and you may get hungry more often. Your unborn baby (fetus) is growing rapidly. At the end of the sixth month, he or she is about 9 inches long and weighs about 1 pounds. You will likely feel the baby move (quickening) between 18 and 20 weeks of pregnancy. Follow these instructions at home:  Avoid all smoking, herbs, and alcohol. Avoid drugs  not approved by your doctor.  Do not use any tobacco products, including cigarettes, chewing tobacco, and electronic cigarettes. If you need help quitting, ask your doctor. You may get counseling or other support to help you quit.  Only take medicine as told by your doctor. Some medicines are safe and some are not during pregnancy.  Exercise only as told by your doctor. Stop exercising if you start having cramps.  Eat regular, healthy meals.  Wear a good support bra if your breasts are tender.  Do not use hot tubs, steam rooms, or saunas.  Wear your seat belt when driving.  Avoid raw meat, uncooked cheese, and liter boxes and soil used by cats.  Take your prenatal vitamins.  Take 1500-2000 milligrams of calcium daily starting at the 20th week of pregnancy until you  deliver your baby.  Try taking medicine that helps you poop (stool softener) as needed, and if your doctor approves. Eat more fiber by eating fresh fruit, vegetables, and whole grains. Drink enough fluids to keep your pee (urine) clear or pale yellow.  Take warm water baths (sitz baths) to soothe pain or discomfort caused by hemorrhoids. Use hemorrhoid cream if your doctor approves.  If you have puffy, bulging veins (varicose veins), wear support hose. Raise (elevate) your feet for 15 minutes, 3-4 times a day. Limit salt in your diet.  Avoid heavy lifting, wear low heals, and sit up straight.  Rest with your legs raised if you have leg cramps or low back pain.  Visit your dentist if you have not gone during your pregnancy. Use a soft toothbrush to brush your teeth. Be gentle when you floss.  You can have sex (intercourse) unless your doctor tells you not to.  Go to your doctor visits. Get help if:  You feel dizzy.  You have mild cramps or pressure in your lower belly (abdomen).  You have a nagging pain in your belly area.  You continue to feel sick to your stomach (nauseous), throw up (vomit), or have watery poop (diarrhea).  You have bad smelling fluid coming from your vagina.  You have pain with peeing (urination). Get help right away if:  You have a fever.  You are leaking fluid from your vagina.  You have spotting or bleeding from your vagina.  You have severe belly cramping or pain.  You lose or gain weight rapidly.  You have trouble catching your breath and have chest pain.  You notice sudden or extreme puffiness (swelling) of your face, hands, ankles, feet, or legs.  You have not felt the baby move in over an hour.  You have severe headaches that do not go away with medicine.  You have vision changes. This information is not intended to replace advice given to you by your health care provider. Make sure you discuss any questions you have with your health care  provider. Document Released: 08/16/2009 Document Revised: 10/28/2015 Document Reviewed: 07/23/2012 Elsevier Interactive Patient Education  2017 [employer]

## 2016-07-28 NOTE — Progress Notes (Signed)
New OB is here for her physical.States she had 2 episodes of brownish discharge denies cramping.

## 2016-07-31 LAB — PAP IG W/ RFLX HPV ASCU: PAP SMEAR COMMENT: 0

## 2016-08-01 LAB — NUSWAB VAGINITIS PLUS (VG+)
CANDIDA ALBICANS, NAA: NEGATIVE
CHLAMYDIA TRACHOMATIS, NAA: NEGATIVE
Candida glabrata, NAA: NEGATIVE
Neisseria gonorrhoeae, NAA: NEGATIVE
Trich vag by NAA: NEGATIVE

## 2016-08-03 LAB — INFORMASEQ(SM) PRENATAL TEST
FETAL NUMBER: 1
Fetal Fraction (%):: 8.7
Gestational Age at Collection: 11.1 weeks

## 2016-08-25 ENCOUNTER — Ambulatory Visit (INDEPENDENT_AMBULATORY_CARE_PROVIDER_SITE_OTHER): Payer: 59 | Admitting: Certified Nurse Midwife

## 2016-08-25 ENCOUNTER — Encounter: Payer: Self-pay | Admitting: Certified Nurse Midwife

## 2016-08-25 VITALS — BP 107/57 | HR 73 | Wt 140.2 lb

## 2016-08-25 DIAGNOSIS — Z3492 Encounter for supervision of normal pregnancy, unspecified, second trimester: Secondary | ICD-10-CM

## 2016-08-25 LAB — POCT URINALYSIS DIPSTICK
BILIRUBIN UA: NEGATIVE
Glucose, UA: NEGATIVE
Ketones, UA: NEGATIVE
Leukocytes, UA: NEGATIVE
Nitrite, UA: NEGATIVE
PH UA: 5 (ref 5.0–8.0)
Protein, UA: NEGATIVE
RBC UA: NEGATIVE
SPEC GRAV UA: 1.015 (ref 1.030–1.035)
Urobilinogen, UA: NEGATIVE (ref ?–2.0)

## 2016-08-25 NOTE — Progress Notes (Signed)
ROB-Pt doing well. Kristine King and her daughter want to know the gender of the baby, but her husband does not. Discussed round ligament pain and use of abdominal support. RTC x 4-5 weeks for anatomy scan and ROB.

## 2016-08-25 NOTE — Patient Instructions (Signed)
Round Ligament Pain The round ligament is a cord of muscle and tissue that helps to support the uterus. It can become a source of pain during pregnancy if it becomes stretched or twisted as the baby grows. The pain usually begins in the second trimester of pregnancy, and it can come and go until the baby is delivered. It is not a serious problem, and it does not cause harm to the baby. Round ligament pain is usually a short, sharp, and pinching pain, but it can also be a dull, lingering, and aching pain. The pain is felt in the lower side of the abdomen or in the groin. It usually starts deep in the groin and moves up to the outside of the hip area. Pain can occur with:  A sudden change in position.  Rolling over in bed.  Coughing or sneezing.  Physical activity. Follow these instructions at home: Watch your condition for any changes. Take these steps to help with your pain:  When the pain starts, relax. Then try:  Sitting down.  Flexing your knees up to your abdomen.  Lying on your side with one pillow under your abdomen and another pillow between your legs.  Sitting in a warm bath for 15-20 minutes or until the pain goes away.  Take over-the-counter and prescription medicines only as told by your health care provider.  Move slowly when you sit and stand.  Avoid long walks if they cause pain.  Stop or lessen your physical activities if they cause pain. Contact a health care provider if:  Your pain does not go away with treatment.  You feel pain in your back that you did not have before.  Your medicine is not helping. Get help right away if:  You develop a fever or chills.  You develop uterine contractions.  You develop vaginal bleeding.  You develop nausea or vomiting.  You develop diarrhea.  You have pain when you urinate. This information is not intended to replace advice given to you by your health care provider. Make sure you discuss any questions you have with  your health care provider. Document Released: 02/29/2008 Document Revised: 10/28/2015 Document Reviewed: 07/29/2014 Elsevier Interactive Patient Education  2017 Elsevier Inc.  

## 2016-08-25 NOTE — Progress Notes (Signed)
Pt is here for a routine visit.

## 2016-09-29 ENCOUNTER — Ambulatory Visit (INDEPENDENT_AMBULATORY_CARE_PROVIDER_SITE_OTHER): Payer: 59 | Admitting: Certified Nurse Midwife

## 2016-09-29 ENCOUNTER — Encounter: Payer: Self-pay | Admitting: Certified Nurse Midwife

## 2016-09-29 ENCOUNTER — Ambulatory Visit (INDEPENDENT_AMBULATORY_CARE_PROVIDER_SITE_OTHER): Payer: 59

## 2016-09-29 VITALS — BP 100/68 | HR 72 | Wt 147.0 lb

## 2016-09-29 DIAGNOSIS — Z3490 Encounter for supervision of normal pregnancy, unspecified, unspecified trimester: Secondary | ICD-10-CM | POA: Insufficient documentation

## 2016-09-29 DIAGNOSIS — Q638 Other specified congenital malformations of kidney: Secondary | ICD-10-CM

## 2016-09-29 DIAGNOSIS — Z3492 Encounter for supervision of normal pregnancy, unspecified, second trimester: Secondary | ICD-10-CM

## 2016-09-29 DIAGNOSIS — Z3A20 20 weeks gestation of pregnancy: Secondary | ICD-10-CM

## 2016-09-29 LAB — POCT URINALYSIS DIPSTICK
Bilirubin, UA: NEGATIVE
Blood, UA: NEGATIVE
GLUCOSE UA: NEGATIVE
Ketones, UA: NEGATIVE
Leukocytes, UA: NEGATIVE
NITRITE UA: NEGATIVE
PROTEIN UA: NEGATIVE
SPEC GRAV UA: 1.015 (ref 1.010–1.025)
UROBILINOGEN UA: NEGATIVE U/dL — AB
pH, UA: 6.5 (ref 5.0–8.0)

## 2016-09-29 NOTE — Patient Instructions (Signed)

## 2016-09-29 NOTE — Progress Notes (Signed)
ROB, doing well. No complaints Anatomy ultrasound today. See be low for results. Discussed musculoskeletal pains in pregnancy . Note given for her job to allow sitting and standing.  She is to return in 2 wks for follow up ultrasound and in 4 wks for ROB.   Doreene Burke, CNM   ULTRASOUND REPORT  Location: [facility] Date of Service: 09/29/16  Indications: Anatomy Findings:  Singleton intrauterine pregnancy is visualized with FHR at 145 BPM. Biometrics give an (U/S) Gestational age of [redacted] weeks and 5 days, and an (U/S) EDD of 02/11/17; this correlates with the clinically established EDD of 02/14/17.  Fetal presentation is vertex, spine anterior.  EFW: 398 grams ( 0 lbs. 14 oz.). Placenta: Anterior, grade 0 and remote to cervix at 6.5 cm. AFI: Subjectively adequate.  Anatomic survey is incomplete due to fetal position. Anatomy seen on today's scan appears WNL. Anatomy needed to complete scan: heart views, nose/lips, face, profile and 5th digit. Gender - Female.   Right Ovary measures 3.0 x 1.9 x 2.0 cm, and appears WNL. Left Ovary measures 2.2 x 1.1 x 1.4 cm, and appears WNL. There is no evidence of a corpus luteal cyst. Survey of the adnexa demonstrates no adnexal masses. There is no free peritoneal fluid in the cul de sac.  Impression: 1. 20 week 5 day Viable Singleton Intrauterine pregnancy by U/S. 2. (U/S) EDD is consistent with Clinically established (LMP) EDD of 02/14/17. 3. Incomplete anatomy scan due to fetal position. Need: heart views, nose/lips, face, profile and 5th digit.  Recommendations: 1.Clinical correlation with the patient's History and Physical Exam. 2. Patient to return in 1-2 weeks to complete anatomy scan.  Revonda Humphrey, RDMS, RVT  ADDENDUM: There is bilateral fetal renal pelves dilatation. Left > right.  Left = 5.1 mm and Right = 4.2 mm. (greater than 4 mm in the 2nd trimester is considered abnormal).  Revonda Humphrey, RDMS, RVT

## 2016-09-29 NOTE — Progress Notes (Signed)
Pt is here for a prenatal visit.

## 2016-10-06 ENCOUNTER — Other Ambulatory Visit: Payer: Self-pay | Admitting: Certified Nurse Midwife

## 2016-10-06 DIAGNOSIS — Z0489 Encounter for examination and observation for other specified reasons: Secondary | ICD-10-CM

## 2016-10-06 DIAGNOSIS — IMO0002 Reserved for concepts with insufficient information to code with codable children: Secondary | ICD-10-CM

## 2016-10-13 ENCOUNTER — Ambulatory Visit (INDEPENDENT_AMBULATORY_CARE_PROVIDER_SITE_OTHER): Payer: 59

## 2016-10-13 DIAGNOSIS — Z048 Encounter for examination and observation for other specified reasons: Secondary | ICD-10-CM | POA: Diagnosis not present

## 2016-10-13 DIAGNOSIS — Z0489 Encounter for examination and observation for other specified reasons: Secondary | ICD-10-CM

## 2016-10-13 DIAGNOSIS — IMO0002 Reserved for concepts with insufficient information to code with codable children: Secondary | ICD-10-CM

## 2016-10-24 ENCOUNTER — Encounter: Payer: Self-pay | Admitting: Certified Nurse Midwife

## 2016-10-27 ENCOUNTER — Encounter: Payer: 59 | Admitting: Obstetrics and Gynecology

## 2016-11-01 ENCOUNTER — Ambulatory Visit (INDEPENDENT_AMBULATORY_CARE_PROVIDER_SITE_OTHER): Payer: 59 | Admitting: Obstetrics and Gynecology

## 2016-11-01 VITALS — BP 109/66 | HR 78 | Wt 155.4 lb

## 2016-11-01 DIAGNOSIS — Z3492 Encounter for supervision of normal pregnancy, unspecified, second trimester: Secondary | ICD-10-CM

## 2016-11-01 NOTE — Progress Notes (Signed)
ROB-discussed swelling in pregnancy, discussed weight gain. Repeat ultrasound and glucola and next visit.

## 2016-11-01 NOTE — Progress Notes (Signed)
ROB- pt is doing well 

## 2016-11-29 ENCOUNTER — Encounter: Payer: Self-pay | Admitting: Certified Nurse Midwife

## 2016-11-29 ENCOUNTER — Ambulatory Visit (INDEPENDENT_AMBULATORY_CARE_PROVIDER_SITE_OTHER): Payer: 59

## 2016-11-29 ENCOUNTER — Ambulatory Visit (INDEPENDENT_AMBULATORY_CARE_PROVIDER_SITE_OTHER): Payer: 59 | Admitting: Certified Nurse Midwife

## 2016-11-29 VITALS — BP 103/69 | HR 74 | Wt 157.5 lb

## 2016-11-29 DIAGNOSIS — Z3492 Encounter for supervision of normal pregnancy, unspecified, second trimester: Secondary | ICD-10-CM | POA: Diagnosis not present

## 2016-11-29 DIAGNOSIS — Z131 Encounter for screening for diabetes mellitus: Secondary | ICD-10-CM

## 2016-11-29 DIAGNOSIS — Z3483 Encounter for supervision of other normal pregnancy, third trimester: Secondary | ICD-10-CM | POA: Diagnosis not present

## 2016-11-29 DIAGNOSIS — Z130 Encounter for screening for diseases of the blood and blood-forming organs and certain disorders involving the immune mechanism: Secondary | ICD-10-CM

## 2016-11-29 DIAGNOSIS — Z23 Encounter for immunization: Secondary | ICD-10-CM

## 2016-11-29 LAB — POCT URINALYSIS DIPSTICK
BILIRUBIN UA: NEGATIVE
Blood, UA: NEGATIVE
GLUCOSE UA: NEGATIVE
KETONES UA: NEGATIVE
LEUKOCYTES UA: NEGATIVE
NITRITE UA: NEGATIVE
Protein, UA: NEGATIVE
Spec Grav, UA: 1.01 (ref 1.010–1.025)
Urobilinogen, UA: 0.2 E.U./dL
pH, UA: 5 (ref 5.0–8.0)

## 2016-11-29 NOTE — Patient Instructions (Addendum)
Glucose Tolerance Test During Pregnancy The glucose tolerance test (GTT) is a blood test used to determine if you have developed a type of diabetes during pregnancy (gestational diabetes). This is when your body does not properly process sugar (glucose) in the food you eat, resulting in high blood glucose levels. Typically, a GTT is done after you have had a 1-hour glucose test with results that indicate you possibly have gestational diabetes. It may also be done if:  You have a history of giving birth to very large babies or have experienced repeated fetal loss (stillbirth).  You have signs and symptoms of diabetes, such as: ? Changes in your vision. ? Tingling or numbness in your hands or feet. ? Changes in hunger, thirst, and urination not otherwise explained by your pregnancy.  The GTT lasts about 3 hours. You will be given a sugar-water solution to drink at the beginning of the test. You will have blood drawn before you drink the solution and then again 1, 2, and 3 hours after you drink it. You will not be allowed to eat or drink anything else during the test. You must remain at the testing location to make sure that your blood is drawn on time. You should also avoid exercising during the test, because exercise can alter test results. How do I prepare for this test? Eat normally for 3 days prior to the GTT test, including having plenty of carbohydrate-rich foods. Do not eat or drink anything except water during the final 12 hours before the test. In addition, your health care provider may ask you to stop taking certain medicines before the test. What do the results mean? It is your responsibility to obtain your test results. Ask the lab or department performing the test when and how you will get your results. Contact your health care provider to discuss any questions you have about your results. Range of Normal Values Ranges for normal values may vary among different labs and hospitals. You  should always check with your health care provider after having lab work or other tests done to discuss whether your values are considered within normal limits. Normal levels of blood glucose are as follows:  Fasting: less than 105 mg/dL.  1 hour after drinking the solution: less than 190 mg/dL.  2 hours after drinking the solution: less than 165 mg/dL.  3 hours after drinking the solution: less than 145 mg/dL.  Some substances can interfere with GTT results. These may include:  Blood pressure and heart failure medicines, including beta blockers, furosemide, and thiazides.  Anti-inflammatory medicines, including aspirin.  Nicotine.  Some psychiatric medicines.  Meaning of Results Outside Normal Value Ranges GTT test results that are above normal values may indicate a number of health problems, such as:  Gestational diabetes.  Acute stress response.  Cushing syndrome.  Tumors such as pheochromocytoma or glucagonoma.  Long-term kidney problems.  Pancreatitis.  Hyperthyroidism.  Current infection.  Discuss your test results with your health care provider. He or she will use the results to make a diagnosis and determine a treatment plan that is right for you. This information is not intended to replace advice given to you by your health care provider. Make sure you discuss any questions you have with your health care provider. Document Released: 11/21/2011 Document Revised: 10/28/2015 Document Reviewed: 09/26/2013 Elsevier Interactive Patient Education  2017 [employer].  Pregnancy and Travel Most pregnant woman can safely travel until the last month of the pregnancy. However, pregnant women with  medical problems or problems with their pregnancy should limit or avoid travel. The best time to travel is between 14 and 28 weeks of the pregnancy. During this period, morning sickness should be minimal and other problems are less likely to develop. General travel tips Before  you go:  Discuss your trip with your health care provider and get examined shortly before you go.  Get a copy of your medical records and be sure to take them with you.  Try to get names of doctors and hospitals in the area you will be visiting.  Pack your pillow if you can.  Get a good night's sleep the night before you make your trip.  During your trip:  Ask for locations of doctors and hospitals if you did not do this before leaving.  Wear flat, comfortable shoes.  Eat a balanced diet, drink a lot of fluids, and take your vitamins and supplements.  Do not wear yourself out.  Do not ride on a motorcycle.  Rest. If you spent a lot of time traveling, lie down for 30 or more minutes with your feet slightly raised after you reach your destination.  Tips for traveling to a foreign country Before you go:  Ask your health care provider if there are any medicines that are safe to take if you get diarrhea, constipation, nausea, or vomiting.  Make copies of your medical records in case you lose the originals.  During your trip:  Do not eat uncooked foods of any kind.  Drink bottled water and do not use ice.  Wash fruits and vegetables with hot, soapy water.  Only drink pasteurized milk.  Tips for traveling by car  Wear your seat belt properly.  If you are in the front seat, sit as far away from the dashboard as possible to avoid getting hit hard if the air bag deploys in an accident.  Stop about every 2 hours to use the restroom and walk around. This helps the circulation in your legs.  Keep water, crackers, and fruit in the car.  Do not travel for more than 6 hours a day. Tips for traveling by bus  Before making a reservation, ask whether your bus will have a restroom.  Take water, crackers, and fruit with you.  Get out and walk around if and when the bus stops.  Move your arms and legs when seated. This helps with your circulation. Tips for traveling by  train Before making a reservation, ask if your train will have a sleeping car and more than one restroom. Tips for traveling by airplane  Before booking your trip, ask about the airline's rules about pregnancy. Pregnant women may be restricted from flying after a certain time of the pregnancy. Every airline has its own rules and regulations.  Ask whether the airplane cabin will be pressurized. Do not board an unpressurized plane that will fly above 7,000 ft (2,100 km).  Try to get a bulkhead or an aisle seat.  Wear layered clothing because the temperature in the cabin can change.  Take water, crackers, and fruit with you on the airplane.  Put all your medicines and medical records in your carry-on bag.  Avoid drinks with caffeine and do not eat a big meal.  Do not walk around the airplane to stretch your legs.  Move your arms and legs while sitting to help with your circulation.  Wear your seat belt at all times. Tips for traveling by cruise ship  Before booking your  trip, ask the following questions: ? Are pregnant women allowed on the cruise ship? ? Is there a medical facility and health care provider on board? ? Does the ship dock in cities where there are health care providers and medical facilities?  Before booking your trip, ask your health care provider if: ? It is safe for you to take medicines if you get seasick. ? It is safe for you to wear acupressure wristbands to prevent getting seasick. If your health care provider says it is safe, consider purchasing one. This information is not intended to replace advice given to you by your health care provider. Make sure you discuss any questions you have with your health care provider. Document Released: 05/04/2008 Document Revised: 10/28/2015 Document Reviewed: 04/18/2013 Elsevier Interactive Patient Education  2017 [employer]

## 2016-11-29 NOTE — Progress Notes (Signed)
Pt is here for an OB visit. 1hr,H&H,Tdap and transfusion papers done.

## 2016-11-29 NOTE — Progress Notes (Signed)
ROB, doing well. No complaints. TDap, BTC, GTT, & H&H today. Pt states that she will be going out of town 12/23/16. Discussed scheduling of appointment. She will come on the 9th and the 20th of July. Ultrasound follow up today shows renal pelves appear WNL on today's ultrasound. See below for full report.   Doreene BurkeAnnie Zarielle Cea, CNM  ULTRASOUND REPORT  Location: [facility] Date of Service: 11/29/16  Indications: F/U Dilated renal pelves Findings:  Mason JimSingleton intrauterine pregnancy is visualized with FHR at 140 BPM.   Fetal presentation is vertex, spine right lateral.  Placenta: Anterior, grade 2 with some placental lakes and a few calcifications seen. AFI: Adequate at 17.3 cm  Anatomic survey of the fetal stomach, kidneys, bladder and lateral ventricles appear WNL. Gender - Female.  The fetal right renal pelvis today measures: 4.6 mm ( abnormal for  3rd trimester is >7 mm) The fetal left renal pelvis today measures: 5.9 mm which is WNL (see above).   Impression: 1.  The fetal renal pelves appear WNL on today's ultrasound (see body of note for measurements). 2. The placenta is grade 2 with some placental lakes and calcifications noted. 3. The AFI is WNL at 17.3 cm  Recommendations: 1.Clinical correlation with the patient's History and Physical Exam.   Revonda Humphreyeresa Elliott, RDMS, RVT

## 2016-11-30 LAB — HEMOGLOBIN AND HEMATOCRIT, BLOOD
HEMOGLOBIN: 9 g/dL — AB (ref 11.1–15.9)
Hematocrit: 29.1 % — ABNORMAL LOW (ref 34.0–46.6)

## 2016-11-30 LAB — GLUCOSE, 1 HOUR GESTATIONAL: GESTATIONAL DIABETES SCREEN: 110 mg/dL (ref 65–139)

## 2016-12-01 ENCOUNTER — Encounter: Payer: Self-pay | Admitting: Certified Nurse Midwife

## 2016-12-11 ENCOUNTER — Ambulatory Visit (INDEPENDENT_AMBULATORY_CARE_PROVIDER_SITE_OTHER): Payer: 59 | Admitting: Certified Nurse Midwife

## 2016-12-11 VITALS — BP 104/56 | HR 78 | Wt 160.2 lb

## 2016-12-11 DIAGNOSIS — Z3493 Encounter for supervision of normal pregnancy, unspecified, third trimester: Secondary | ICD-10-CM

## 2016-12-11 LAB — POCT URINALYSIS DIPSTICK
Bilirubin, UA: NEGATIVE
Blood, UA: NEGATIVE
Glucose, UA: NEGATIVE
Ketones, UA: NEGATIVE
LEUKOCYTES UA: NEGATIVE
NITRITE UA: NEGATIVE
PROTEIN UA: NEGATIVE
SPEC GRAV UA: 1.01 (ref 1.010–1.025)
UROBILINOGEN UA: 0.2 U/dL
pH, UA: 6.5 (ref 5.0–8.0)

## 2016-12-11 NOTE — Progress Notes (Signed)
ROB- pt is doing well 

## 2016-12-11 NOTE — Patient Instructions (Addendum)
Doren Custard Class  November 15, 2016  Wednesday 7:00p - 9:00p  [place], [place]  December 20, 2016  Wednesday [place], [place]    January 24, 2017   Wednesday [place], [place]  February 21, 2017  Wednesday  [place], [place]  March 21, 2017 Wednesday [place], [place]  Interested in a waterbirth?  This informational class will help you discover whether waterbirth is the right fit for you.  Education about waterbirth itself, supplies you would need and how to assemble your support team is what you can expect from this class.  Some obstetrical practices require this class in order to pursue a waterbirth.  (Not all obstetrical practices offer waterbirth check with your healthcare provider)  Register only the expectant mom, but you are encouraged to bring your partner to class!  Fees & Payment No fee  Register Online [email or web address] Search [place] 2018 Prenatal Education Class Schedule Register at [email or web address] in the [place] or call Kerman Passey at [phone] 9:00a-5:00p M-F  Childbirth Preparation Certified Childbirth Educators teach this 5 week course.  Expectant parents are encouraged to take this class in their 3rd trimester, completing it by their 35-36th week. Meets in [facility], Lower Level.  Mondays Thursdays  7:00-9:00 p 7:00-9:00 p  July 23 - August 20 July 19 - August 16  September 17 - October 15 September 6 -October 4  November 5 - December 3 October 25 - November 29   No Class on Thanksgiving Day -November 22  Childbirth Preparation Refresher Course For those who have previously attended Prepared Childbirth Preparation classes, this class in incorporated into the 3rd and 4th  classes in the Monday night childbirth series.  Course meets in the [facility]. Lower Level from 7:00p - 9:00p  August 6 & 13  October 1 & 8  November 19 & 26   Weekend Childbirth Waymon Amato Classes are held Saturday & Sunday, 1:00 5:00p Course meets in [facility] [place] Level  August 4 & 5  November 3 & 4    The BirthPlace Tours Free tours are held on the third Sunday of each month at 3 pm.  The tour meets in the third floor waiting area and will take approximately 30 minutes.  Tours are also included in Childbirth class series as well as Brother/Sister class.  An online virtual tour can be seen at [email or web address].         Breastfeeding & Infant Nutrition The course incorporates returning to work or school.  Breast milk collection and storage with basic breastfeeding and infant nutrition. This two-class course is held the 2nd and 3rd Tuesday of each month from 7:00 -9:00 pm.  Course meets in the [place] [place] [facility] Lower level  June 12 & 19 July-No Class  August 14 & 21 September 11 &18  September 11 & 18 October 9 &16  November 13 & 20 December 11 & 18   [employer] welcomes any mother for a social outing with other Moms to share experiences and challenges in an informal setting.  Meets the 1st Thursday and 3rd Thursday 11:30a-1:00 pm of each month in [facility] 3rd floor classroom.  No registration required.  Newborn Essentials This course covers bathing, diapering, swaddling and more with practice on lifelike dolls.  Participants  will also learn safety tips and infant CPR (Not for certification).  It is held the 1st Wednesday of each month from 7:00p-9:00p in the [facility], Lower level.  June 6 July- No Class  August 1 September 5  October 3 November 7  December 7    Preparing Big Brother & Sister This one session course prepares children and their parents for the arrival of a new baby.  It is held on the 1st Tuesday of  each month from 6:30p - 8:00p. Course meets in the [facility], Lower level.  July-No Class August 7  September 4 October 2  November 6 December 4   [place] for [employer]  Dads This nationally acclaimed class helps expecting and new dads with the basic skills and confidence to bond with their infants, support their mates, and provide a safe and healthy home environment for their new family. Classes are held the 2nd Saturday of every month from 9:00a - 12:00 noon.  Course meets in the [facility] Lower level.  June 9 August 11  October 13 No Class in December  Pregnancy and Travel Most pregnant woman can safely travel until the last month of the pregnancy. However, pregnant women with medical problems or problems with their pregnancy should limit or avoid travel. The best time to travel is between 14 and 28 weeks of the pregnancy. During this period, morning sickness should be minimal and other problems are less likely to develop. General travel tips Before you go:  Discuss your trip with your health care provider and get examined shortly before you go.  Get a copy of your medical records and be sure to take them with you.  Try to get names of doctors and hospitals in the area you will be visiting.  Pack your pillow if you can.  Get a good night's sleep the night before you make your trip.  During your trip:  Ask for locations of doctors and hospitals if you did not do this before leaving.  Wear flat, comfortable shoes.  Eat a balanced diet, drink a lot of fluids, and take your vitamins and supplements.  Do not wear yourself out.  Do not ride on a motorcycle.  Rest. If you spent a lot of time traveling, lie down for 30 or more minutes with your feet slightly raised after you reach your destination.  Tips for traveling to a foreign country Before you go:  Ask your health care provider if there are any medicines that are safe to take if you get diarrhea,  constipation, nausea, or vomiting.  Make copies of your medical records in case you lose the originals.  During your trip:  Do not eat uncooked foods of any kind.  Drink bottled water and do not use ice.  Wash fruits and vegetables with hot, soapy water.  Only drink pasteurized milk.  Tips for traveling by car  Wear your seat belt properly.  If you are in the front seat, sit as far away from the dashboard as possible to avoid getting hit hard if the air bag deploys in an accident.  Stop about every 2 hours to use the restroom and walk around. This helps the circulation in your legs.  Keep water, crackers, and fruit in the car.  Do not travel for more than 6 hours a day. Tips for traveling by bus  Before making a reservation, ask whether your bus will have a restroom.  Take water, crackers, and fruit  with you.  Get out and walk around if and when the bus stops.  Move your arms and legs when seated. This helps with your circulation. Tips for traveling by train Before making a reservation, ask if your train will have a sleeping car and more than one restroom. Tips for traveling by airplane  Before booking your trip, ask about the airline's rules about pregnancy. Pregnant women may be restricted from flying after a certain time of the pregnancy. Every airline has its own rules and regulations.  Ask whether the airplane cabin will be pressurized. Do not board an unpressurized plane that will fly above 7,000 ft (2,100 km).  Try to get a bulkhead or an aisle seat.  Wear layered clothing because the temperature in the cabin can change.  Take water, crackers, and fruit with you on the airplane.  Put all your medicines and medical records in your carry-on bag.  Avoid drinks with caffeine and do not eat a big meal.  Do not walk around the airplane to stretch your legs.  Move your arms and legs while sitting to help with your circulation.  Wear your seat belt at all  times. Tips for traveling by cruise ship  Before booking your trip, ask the following questions: ? Are pregnant women allowed on the cruise ship? ? Is there a medical facility and health care provider on board? ? Does the ship dock in cities where there are health care providers and medical facilities?  Before booking your trip, ask your health care provider if: ? It is safe for you to take medicines if you get seasick. ? It is safe for you to wear acupressure wristbands to prevent getting seasick. If your health care provider says it is safe, consider purchasing one. This information is not intended to replace advice given to you by your health care provider. Make sure you discuss any questions you have with your health care provider. Document Released: 05/04/2008 Document Revised: 10/28/2015 Document Reviewed: 04/18/2013 Elsevier Interactive Patient Education  2017 Elsevier Inc.  Cord Blood Banking Information Cord blood banking is the process of collecting and storing the blood that is in the umbilical cord and placenta at the time of delivery. This blood contains stem cells, which can be used to treat many blood diseases, immune system disorders, and childhood cancers. Stem cells can also be used to research certain diseases and treatments. Many people who choose cord blood banking donate the blood. Donated blood can be used in lifesaving treatments or for research. Other people choose to store the blood privately. Blood that is stored privately can only be used with the person's permission. This option is often chosen if:  A family member needs a stem cell transplant.  The child is part of an ethnic minority.  The child was conceived through in vitro fertilization.  What should I look for in a blood bank? A blood bank is the organization that coordinates cord blood banking. Make sure the cord blood bank that you use:  Is accredited.  Is financially stable.  Handles a large  volume of cord blood samples.  Has a procedure in place for transport and storage.  Allows you the option of transferring your cord blood sample.  Has a procedure in place if the bank goes out of business.  Clearly states all costs and limits to future costs.  People who choose to donate cord blood should not need to pay for blood banking. People who keep the blood for  private use will need to pay for the first (initial) storage and pay a fee each year (annual fee). Other fees may also apply. What are the risks of cord blood banking? There are no health risks associated with cord blood banking. It is considered safe. How should I prepare? You must schedule this process at least 4-6 weeks before you will be giving birth. How is the blood collected? The blood is collected as soon as the baby has been delivered. Within 15 minutes of delivery, a health care provider will take these actions to collect the blood:  Clamp the umbilical cord at the top and bottom. This traps the blood in the umbilical cord.  Use a syringe or bag to collect the blood.  Insert needles into the placenta to collect (draw out) more blood.  What happens after the blood is collected? After the blood has been collected:  The blood will be sent to a blood bank.  The blood will be tested for genetic problems and infectious diseases. If the blood tests positive for a genetic problem or a disease, someone will contact you and let you know.  The blood will be frozen.  If your child develops a genetic condition, immune system disorder, or cancer, you will be responsible for contacting the blood bank and letting them know. This information is not intended to replace advice given to you by your health care provider. Make sure you discuss any questions you have with your health care provider. Document Released: 11/09/2009 Document Revised: 10/28/2015 Document Reviewed: 11/09/2014 Elsevier Interactive Patient Education   2018 [employer]

## 2016-12-11 NOTE — Progress Notes (Signed)
ROB-Pt doing well, reports fatigue of pregnancy. Discussed home treatment measures. Plans natural childbirth and breastfeeding. Encouraged sibling tour, copies of [facility] 2018 Prenatal Education class schedule and [facility] Waterbirth class schedule given. Education regarding cord blood banking vs delayed cord clamping. Pt traveling to [place] on December 23, 2016. Additional education regarding Pregnancy and Travel precautions as well as keeping skin covered and use of bug spray. Reviewed red flag symptoms and when to call. RTC as previously scheduled or sooner if needed.

## 2016-12-22 ENCOUNTER — Ambulatory Visit (INDEPENDENT_AMBULATORY_CARE_PROVIDER_SITE_OTHER): Payer: 59 | Admitting: Certified Nurse Midwife

## 2016-12-22 VITALS — BP 109/59 | HR 81 | Wt 162.5 lb

## 2016-12-22 DIAGNOSIS — Z3483 Encounter for supervision of other normal pregnancy, third trimester: Secondary | ICD-10-CM

## 2016-12-22 LAB — POCT URINALYSIS DIPSTICK
Bilirubin, UA: NEGATIVE
GLUCOSE UA: NEGATIVE
Nitrite, UA: NEGATIVE
PH UA: 5 (ref 5.0–8.0)
RBC UA: NEGATIVE
SPEC GRAV UA: 1.015 (ref 1.010–1.025)
UROBILINOGEN UA: NEGATIVE U/dL — AB

## 2016-12-22 NOTE — Progress Notes (Signed)
Pt is doing well.

## 2016-12-22 NOTE — Patient Instructions (Addendum)
How a Baby Grows During Pregnancy Pregnancy begins when a female's sperm enters a female's egg (fertilization). This happens in one of the tubes (fallopian tubes) that connect the ovaries to the womb (uterus). The fertilized egg is called an embryo until it reaches 10 weeks. From 10 weeks until birth, it is called a fetus. The fertilized egg moves down the fallopian tube to the uterus. Then it implants into the lining of the uterus and begins to grow. The developing fetus receives oxygen and nutrients through the pregnant woman's bloodstream and the tissues that grow (placenta) to support the fetus. The placenta is the life support system for the fetus. It provides nutrition and removes waste. Learning as much as you can about your pregnancy and how your baby is developing can help you enjoy the experience. It can also make you aware of when there might be a problem and when to ask questions. How long does a typical pregnancy last? A pregnancy usually lasts 280 days, or about 40 weeks. Pregnancy is divided into three trimesters:  First trimester: 0-13 weeks.  Second trimester: 14-27 weeks.  Third trimester: 28-40 weeks.  The day when your baby is considered ready to be born (full term) is your estimated date of delivery. How does my baby develop month by month? First month  The fertilized egg attaches to the inside of the uterus.  Some cells will form the placenta. Others will form the fetus.  The arms, legs, brain, spinal cord, lungs, and heart begin to develop.  At the end of the first month, the heart begins to beat.  Second month  The bones, inner ear, eyelids, hands, and feet form.  The genitals develop.  By the end of 8 weeks, all major organs are developing.  Third month  All of the internal organs are forming.  Teeth develop below the gums.  Bones and muscles begin to grow. The spine can flex.  The skin is transparent.  Fingernails and toenails begin to form.  Arms  and legs continue to grow longer, and hands and feet develop.  The fetus is about 3 in (7.6 cm) long.  Fourth month  The placenta is completely formed.  The external sex organs, neck, outer ear, eyebrows, eyelids, and fingernails are formed.  The fetus can hear, swallow, and move its arms and legs.  The kidneys begin to produce urine.  The skin is covered with a white waxy coating (vernix) and very fine hair (lanugo).  Fifth month  The fetus moves around more and can be felt for the first time (quickening).  The fetus starts to sleep and wake up and may begin to suck its finger.  The nails grow to the end of the fingers.  The organ in the digestive system that makes bile (gallbladder) functions and helps to digest the nutrients.  If your baby is a girl, eggs are present in her ovaries. If your baby is a boy, testicles start to move down into his scrotum.  Sixth month  The lungs are formed, but the fetus is not yet able to breathe.  The eyes open. The brain continues to develop.  Your baby has fingerprints and toe prints. Your baby's hair grows thicker.  At the end of the second trimester, the fetus is about 9 in (22.9 cm) long.  Seventh month  The fetus kicks and stretches.  The eyes are developed enough to sense changes in light.  The hands can make a grasping motion.  The   fetus responds to sound.  Eighth month  All organs and body systems are fully developed and functioning.  Bones harden and taste buds develop. The fetus may hiccup.  Certain areas of the brain are still developing. The skull remains soft.  Ninth month  The fetus gains about  lb (0.23 kg) each week.  The lungs are fully developed.  Patterns of sleep develop.  The fetus's head typically moves into a head-down position (vertex) in the uterus to prepare for birth. If the buttocks move into a vertex position instead, the baby is breech.  The fetus weighs 6-9 lbs (2.72-4.08 kg) and is  19-20 in (48.26-50.8 cm) long.  What can I do to have a healthy pregnancy and help my baby develop? Eating and Drinking  Eat a healthy diet. ? Talk with your health care provider to make sure that you are getting the nutrients that you and your baby need. ? Visit www.choosemyplate.gov to learn about creating a healthy diet.  Gain a healthy amount of weight during pregnancy as advised by your health care provider. This is usually 25-35 pounds. You may need to: ? Gain more if you were underweight before getting pregnant or if you are pregnant with more than one baby. ? Gain less if you were overweight or obese when you got pregnant.  Medicines and Vitamins  Take prenatal vitamins as directed by your health care provider. These include vitamins such as folic acid, iron, calcium, and vitamin D. They are important for healthy development.  Take medicines only as directed by your health care provider. Read labels and ask a pharmacist or your health care provider whether over-the-counter medicines, supplements, and prescription drugs are safe to take during pregnancy.  Activities  Be physically active as advised by your health care provider. Ask your health care provider to recommend activities that are safe for you to do, such as walking or swimming.  Do not participate in strenuous or extreme sports.  Lifestyle  Do not drink alcohol.  Do not use any tobacco products, including cigarettes, chewing tobacco, or electronic cigarettes. If you need help quitting, ask your health care provider.  Do not use illegal drugs.  Safety  Avoid exposure to mercury, lead, or other heavy metals. Ask your health care provider about common sources of these heavy metals.  Avoid listeria infection during pregnancy. Follow these precautions: ? Do not eat soft cheeses or deli meats. ? Do not eat hot dogs unless they have been warmed up to the point of steaming, such as in the microwave oven. ? Do not  drink unpasteurized milk.  Avoid toxoplasmosis infection during pregnancy. Follow these precautions: ? Do not change your cat's litter box, if you have a cat. Ask someone else to do this for you. ? Wear gardening gloves while working in the yard.  General Instructions  Keep all follow-up visits as directed by your health care provider. This is important. This includes prenatal care and screening tests.  Manage any chronic health conditions. Work closely with your health care provider to keep conditions, such as diabetes, under control.  How do I know if my baby is developing well? At each prenatal visit, your health care provider will do several different tests to check on your health and keep track of your baby's development. These include:  Fundal height. ? Your health care provider will measure your growing belly from top to bottom using a tape measure. ? Your health care provider will also feel your belly   to determine your baby's position.  Heartbeat. ? An ultrasound in the first trimester can confirm pregnancy and show a heartbeat, depending on how far along you are. ? Your health care provider will check your baby's heart rate at every prenatal visit. ? As you get closer to your delivery date, you may have regular fetal heart rate monitoring to make sure that your baby is not in distress.  Second trimester ultrasound. ? This ultrasound checks your baby's development. It also indicates your baby's gender.  What should I do if I have concerns about my baby's development? Always talk with your health care provider about any concerns that you may have. This information is not intended to replace advice given to you by your health care provider. Make sure you discuss any questions you have with your health care provider. Document Released: 11/08/2007 Document Revised: 10/28/2015 Document Reviewed: 10/29/2013 Elsevier Interactive Patient Education  2018 [employer]  Pregnancy and  Travel Most pregnant woman can safely travel until the last month of the pregnancy. However, pregnant women with medical problems or problems with their pregnancy should limit or avoid travel. The best time to travel is between 14 and 28 weeks of the pregnancy. During this period, morning sickness should be minimal and other problems are less likely to develop. General travel tips Before you go:  Discuss your trip with your health care provider and get examined shortly before you go.  Get a copy of your medical records and be sure to take them with you.  Try to get names of doctors and hospitals in the area you will be visiting.  Pack your pillow if you can.  Get a good night's sleep the night before you make your trip.  During your trip:  Ask for locations of doctors and hospitals if you did not do this before leaving.  Wear flat, comfortable shoes.  Eat a balanced diet, drink a lot of fluids, and take your vitamins and supplements.  Do not wear yourself out.  Do not ride on a motorcycle.  Rest. If you spent a lot of time traveling, lie down for 30 or more minutes with your feet slightly raised after you reach your destination.  Tips for traveling to a foreign country Before you go:  Ask your health care provider if there are any medicines that are safe to take if you get diarrhea, constipation, nausea, or vomiting.  Make copies of your medical records in case you lose the originals.  During your trip:  Do not eat uncooked foods of any kind.  Drink bottled water and do not use ice.  Wash fruits and vegetables with hot, soapy water.  Only drink pasteurized milk.  Tips for traveling by car  Wear your seat belt properly.  If you are in the front seat, sit as far away from the dashboard as possible to avoid getting hit hard if the air bag deploys in an accident.  Stop about every 2 hours to use the restroom and walk around. This helps the circulation in your  legs.  Keep water, crackers, and fruit in the car.  Do not travel for more than 6 hours a day. Tips for traveling by bus  Before making a reservation, ask whether your bus will have a restroom.  Take water, crackers, and fruit with you.  Get out and walk around if and when the bus stops.  Move your arms and legs when seated. This helps with your circulation. Tips for traveling  by train Before making a reservation, ask if your train will have a sleeping car and more than one restroom. Tips for traveling by airplane  Before booking your trip, ask about the airline's rules about pregnancy. Pregnant women may be restricted from flying after a certain time of the pregnancy. Every airline has its own rules and regulations.  Ask whether the airplane cabin will be pressurized. Do not board an unpressurized plane that will fly above 7,000 ft (2,100 km).  Try to get a bulkhead or an aisle seat.  Wear layered clothing because the temperature in the cabin can change.  Take water, crackers, and fruit with you on the airplane.  Put all your medicines and medical records in your carry-on bag.  Avoid drinks with caffeine and do not eat a big meal.  Do not walk around the airplane to stretch your legs.  Move your arms and legs while sitting to help with your circulation.  Wear your seat belt at all times. Tips for traveling by cruise ship  Before booking your trip, ask the following questions: ? Are pregnant women allowed on the cruise ship? ? Is there a medical facility and health care provider on board? ? Does the ship dock in cities where there are health care providers and medical facilities?  Before booking your trip, ask your health care provider if: ? It is safe for you to take medicines if you get seasick. ? It is safe for you to wear acupressure wristbands to prevent getting seasick. If your health care provider says it is safe, consider purchasing one. This information is not  intended to replace advice given to you by your health care provider. Make sure you discuss any questions you have with your health care provider. Document Released: 05/04/2008 Document Revised: 10/28/2015 Document Reviewed: 04/18/2013 Elsevier Interactive Patient Education  2017 [employer].

## 2016-12-22 NOTE — Addendum Note (Signed)
Addended by: Shanda BumpsROY, Ozias Dicenzo W on: 12/22/2016 09:13 AM   Modules accepted: Orders

## 2016-12-22 NOTE — Progress Notes (Signed)
ROB, doing well. No complaints . She is traveling this week, note given. Follow up upon return approximately 2 wks.  Doreene BurkeAnnie Cande Mastropietro, CNM

## 2017-01-08 ENCOUNTER — Telehealth: Payer: Self-pay | Admitting: Certified Nurse Midwife

## 2017-01-08 NOTE — Telephone Encounter (Signed)
Patient lvm to reschedule appointment, I called and lvm for patient to call back to reschedule the appointment. The patient did not disclose any other information.

## 2017-01-09 ENCOUNTER — Encounter: Payer: 59 | Admitting: Certified Nurse Midwife

## 2017-01-11 ENCOUNTER — Ambulatory Visit (INDEPENDENT_AMBULATORY_CARE_PROVIDER_SITE_OTHER): Payer: 59 | Admitting: Certified Nurse Midwife

## 2017-01-11 VITALS — BP 110/69 | HR 72 | Wt 166.8 lb

## 2017-01-11 DIAGNOSIS — Z3483 Encounter for supervision of other normal pregnancy, third trimester: Secondary | ICD-10-CM

## 2017-01-11 DIAGNOSIS — O99013 Anemia complicating pregnancy, third trimester: Secondary | ICD-10-CM

## 2017-01-11 LAB — POCT URINALYSIS DIPSTICK
Bilirubin, UA: NEGATIVE
Blood, UA: NEGATIVE
GLUCOSE UA: NEGATIVE
Ketones, UA: NEGATIVE
NITRITE UA: NEGATIVE
PROTEIN UA: NEGATIVE
SPEC GRAV UA: 1.01 (ref 1.010–1.025)
UROBILINOGEN UA: 0.2 U/dL
pH, UA: 6 (ref 5.0–8.0)

## 2017-01-11 NOTE — Patient Instructions (Signed)
Third Trimester of Pregnancy The third trimester is from week 28 through week 40 (months 7 through 9). The third trimester is a time when the unborn baby (fetus) is growing rapidly. At the end of the ninth month, the fetus is about 20 inches in length and weighs 6-10 pounds. Body changes during your third trimester Your body will continue to go through many changes during pregnancy. The changes vary from woman to woman. During the third trimester:  Your weight will continue to increase. You can expect to gain 25-35 pounds (11-16 kg) by the end of the pregnancy.  You may begin to get stretch marks on your hips, abdomen, and breasts.  You may urinate more often because the fetus is moving lower into your pelvis and pressing on your bladder.  You may develop or continue to have heartburn. This is caused by increased hormones that slow down muscles in the digestive tract.  You may develop or continue to have constipation because increased hormones slow digestion and cause the muscles that push waste through your intestines to relax.  You may develop hemorrhoids. These are swollen veins (varicose veins) in the rectum that can itch or be painful.  You may develop swollen, bulging veins (varicose veins) in your legs.  You may have increased body aches in the pelvis, back, or thighs. This is due to weight gain and increased hormones that are relaxing your joints.  You may have changes in your hair. These can include thickening of your hair, rapid growth, and changes in texture. Some women also have hair loss during or after pregnancy, or hair that feels dry or thin. Your hair will most likely return to normal after your baby is born.  Your breasts will continue to grow and they will continue to become tender. A yellow fluid (colostrum) may leak from your breasts. This is the first milk you are producing for your baby.  Your belly button may stick out.  You may notice more swelling in your hands,  face, or ankles.  You may have increased tingling or numbness in your hands, arms, and legs. The skin on your belly may also feel numb.  You may feel short of breath because of your expanding uterus.  You may have more problems sleeping. This can be caused by the size of your belly, increased need to urinate, and an increase in your body's metabolism.  You may notice the fetus "dropping," or moving lower in your abdomen (lightening).  You may have increased vaginal discharge.  You may notice your joints feel loose and you may have pain around your pelvic bone.  What to expect at prenatal visits You will have prenatal exams every 2 weeks until week 36. Then you will have weekly prenatal exams. During a routine prenatal visit:  You will be weighed to make sure you and the baby are growing normally.  Your blood pressure will be taken.  Your abdomen will be measured to track your baby's growth.  The fetal heartbeat will be listened to.  Any test results from the previous visit will be discussed.  You may have a cervical check near your due date to see if your cervix has softened or thinned (effaced).  You will be tested for Group B streptococcus. This happens between 35 and 37 weeks.  Your health care provider may ask you:  What your birth plan is.  How you are feeling.  If you are feeling the baby move.  If you have had   any abnormal symptoms, such as leaking fluid, bleeding, severe headaches, or abdominal cramping.  If you are using any tobacco products, including cigarettes, chewing tobacco, and electronic cigarettes.  If you have any questions.  Other tests or screenings that may be performed during your third trimester include:  Blood tests that check for low iron levels (anemia).  Fetal testing to check the health, activity level, and growth of the fetus. Testing is done if you have certain medical conditions or if there are problems during the  pregnancy.  Nonstress test (NST). This test checks the health of your baby to make sure there are no signs of problems, such as the baby not getting enough oxygen. During this test, a belt is placed around your belly. The baby is made to move, and its heart rate is monitored during movement.  What is false labor? False labor is a condition in which you feel small, irregular tightenings of the muscles in the womb (contractions) that usually go away with rest, changing position, or drinking water. These are called Braxton Hicks contractions. Contractions may last for hours, days, or even weeks before true labor sets in. If contractions come at regular intervals, become more frequent, increase in intensity, or become painful, you should see your health care provider. What are the signs of labor?  Abdominal cramps.  Regular contractions that start at 10 minutes apart and become stronger and more frequent with time.  Contractions that start on the top of the uterus and spread down to the lower abdomen and back.  Increased pelvic pressure and dull back pain.  A watery or bloody mucus discharge that comes from the vagina.  Leaking of amniotic fluid. This is also known as your "water breaking." It could be a slow trickle or a gush. Let your health care provider know if it has a color or strange odor. If you have any of these signs, call your health care provider right away, even if it is before your due date. Follow these instructions at home: Medicines  Follow your health care provider's instructions regarding medicine use. Specific medicines may be either safe or unsafe to take during pregnancy.  Take a prenatal vitamin that contains at least 600 micrograms (mcg) of folic acid.  If you develop constipation, try taking a stool softener if your health care provider approves. Eating and drinking  Eat a balanced diet that includes fresh fruits and vegetables, whole grains, good sources of protein  such as meat, eggs, or tofu, and low-fat dairy. Your health care provider will help you determine the amount of weight gain that is right for you.  Avoid raw meat and uncooked cheese. These carry germs that can cause birth defects in the baby.  If you have low calcium intake from food, talk to your health care provider about whether you should take a daily calcium supplement.  Eat four or five small meals rather than three large meals a day.  Limit foods that are high in fat and processed sugars, such as fried and sweet foods.  To prevent constipation: ? Drink enough fluid to keep your urine clear or pale yellow. ? Eat foods that are high in fiber, such as fresh fruits and vegetables, whole grains, and beans. Activity  Exercise only as directed by your health care provider. Most women can continue their usual exercise routine during pregnancy. Try to exercise for 30 minutes at least 5 days a week. Stop exercising if you experience uterine contractions.  Avoid heavy   lifting.  Do not exercise in extreme heat or humidity, or at high altitudes.  Wear low-heel, comfortable shoes.  Practice good posture.  You may continue to have sex unless your health care provider tells you otherwise. Relieving pain and discomfort  Take frequent breaks and rest with your legs elevated if you have leg cramps or low back pain.  Take warm sitz baths to soothe any pain or discomfort caused by hemorrhoids. Use hemorrhoid cream if your health care provider approves.  Wear a good support bra to prevent discomfort from breast tenderness.  If you develop varicose veins: ? Wear support pantyhose or compression stockings as told by your healthcare provider. ? Elevate your feet for 15 minutes, 3-4 times a day. Prenatal care  Write down your questions. Take them to your prenatal visits.  Keep all your prenatal visits as told by your health care provider. This is important. Safety  Wear your seat belt at  all times when driving.  Make a list of emergency phone numbers, including numbers for family, friends, the hospital, and police and fire departments. General instructions  Avoid cat litter boxes and soil used by cats. These carry germs that can cause birth defects in the baby. If you have a cat, ask someone to clean the litter box for you.  Do not travel far distances unless it is absolutely necessary and only with the approval of your health care provider.  Do not use hot tubs, steam rooms, or saunas.  Do not drink alcohol.  Do not use any products that contain nicotine or tobacco, such as cigarettes and e-cigarettes. If you need help quitting, ask your health care provider.  Do not use any medicinal herbs or unprescribed drugs. These chemicals affect the formation and growth of the baby.  Do not douche or use tampons or scented sanitary pads.  Do not cross your legs for long periods of time.  To prepare for the arrival of your baby: ? Take prenatal classes to understand, practice, and ask questions about labor and delivery. ? Make a trial run to the hospital. ? Visit the hospital and tour the maternity area. ? Arrange for maternity or paternity leave through employers. ? Arrange for family and friends to take care of pets while you are in the hospital. ? Purchase a rear-facing car seat and make sure you know how to install it in your car. ? Pack your hospital bag. ? Prepare the baby's nursery. Make sure to remove all pillows and stuffed animals from the baby's crib to prevent suffocation.  Visit your dentist if you have not gone during your pregnancy. Use a soft toothbrush to brush your teeth and be gentle when you floss. Contact a health care provider if:  You are unsure if you are in labor or if your water has broken.  You become dizzy.  You have mild pelvic cramps, pelvic pressure, or nagging pain in your abdominal area.  You have lower back pain.  You have persistent  nausea, vomiting, or diarrhea.  You have an unusual or bad smelling vaginal discharge.  You have pain when you urinate. Get help right away if:  Your water breaks before 37 weeks.  You have regular contractions less than 5 minutes apart before 37 weeks.  You have a fever.  You are leaking fluid from your vagina.  You have spotting or bleeding from your vagina.  You have severe abdominal pain or cramping.  You have rapid weight loss or weight gain.    You have shortness of breath with chest pain.  You notice sudden or extreme swelling of your face, hands, ankles, feet, or legs.  Your baby makes fewer than 10 movements in 2 hours.  You have severe headaches that do not go away when you take medicine.  You have vision changes. Summary  The third trimester is from week 28 through week 40, months 7 through 9. The third trimester is a time when the unborn baby (fetus) is growing rapidly.  During the third trimester, your discomfort may increase as you and your baby continue to gain weight. You may have abdominal, leg, and back pain, sleeping problems, and an increased need to urinate.  During the third trimester your breasts will keep growing and they will continue to become tender. A yellow fluid (colostrum) may leak from your breasts. This is the first milk you are producing for your baby.  False labor is a condition in which you feel small, irregular tightenings of the muscles in the womb (contractions) that eventually go away. These are called Braxton Hicks contractions. Contractions may last for hours, days, or even weeks before true labor sets in.  Signs of labor can include: abdominal cramps; regular contractions that start at 10 minutes apart and become stronger and more frequent with time; watery or bloody mucus discharge that comes from the vagina; increased pelvic pressure and dull back pain; and leaking of amniotic fluid. This information is not intended to replace advice  given to you by your health care provider. Make sure you discuss any questions you have with your health care provider. Document Released: 05/16/2001 Document Revised: 10/28/2015 Document Reviewed: 07/23/2012 Elsevier Interactive Patient Education  2017 [employer]  

## 2017-01-11 NOTE — Progress Notes (Signed)
ROB-Reports lower extremity swelling that increased during her work day. Discussed home treatment measures including increased hydration, compression stocks, rest and ambulation during work hours, use of swimming pool, and warm baths with epsom salt. PE precaution given. Reviewed red flag symptoms and when to call. Will recheck CBC next visit due to history of anemia in pregnancy. RTC x 1 week for 36 week culture and ROB.

## 2017-01-18 ENCOUNTER — Ambulatory Visit (INDEPENDENT_AMBULATORY_CARE_PROVIDER_SITE_OTHER): Payer: 59 | Admitting: Certified Nurse Midwife

## 2017-01-18 ENCOUNTER — Other Ambulatory Visit: Payer: Self-pay

## 2017-01-18 VITALS — BP 112/74 | HR 70 | Wt 166.4 lb

## 2017-01-18 DIAGNOSIS — Z113 Encounter for screening for infections with a predominantly sexual mode of transmission: Secondary | ICD-10-CM

## 2017-01-18 DIAGNOSIS — Z3483 Encounter for supervision of other normal pregnancy, third trimester: Secondary | ICD-10-CM

## 2017-01-18 DIAGNOSIS — Z130 Encounter for screening for diseases of the blood and blood-forming organs and certain disorders involving the immune mechanism: Secondary | ICD-10-CM

## 2017-01-18 DIAGNOSIS — O99013 Anemia complicating pregnancy, third trimester: Secondary | ICD-10-CM

## 2017-01-18 LAB — POCT URINALYSIS DIPSTICK
BILIRUBIN UA: NEGATIVE
Blood, UA: NEGATIVE
Glucose, UA: NEGATIVE
KETONES UA: NEGATIVE
Nitrite, UA: NEGATIVE
PROTEIN UA: NEGATIVE
SPEC GRAV UA: 1.015 (ref 1.010–1.025)
Urobilinogen, UA: 0.2 E.U./dL
pH, UA: 6 (ref 5.0–8.0)

## 2017-01-18 NOTE — Addendum Note (Signed)
Addended by: Marchelle FolksMILLER, Nithin Demeo G on: 01/18/2017 09:50 AM   Modules accepted: Orders

## 2017-01-18 NOTE — Patient Instructions (Signed)
Vaginal Delivery Vaginal delivery means that you will give birth by pushing your baby out of your birth canal (vagina). A team of health care providers will help you before, during, and after vaginal delivery. Birth experiences are unique for every woman and every pregnancy, and birth experiences vary depending on where you choose to give birth. What should I do to prepare for my baby's birth? Before your baby is born, it is important to talk with your health care provider about:  Your labor and delivery preferences. These may include: ? Medicines that you may be given. ? How you will manage your pain. This might include non-medical pain relief techniques or injectable pain relief such as epidural analgesia. ? How you and your baby will be monitored during labor and delivery. ? Who may be in the labor and delivery room with you. ? Your feelings about surgical delivery of your baby (cesarean delivery, or C-section) if this becomes necessary. ? Your feelings about receiving donated blood through an IV tube (blood transfusion) if this becomes necessary.  Whether you are able: ? To take pictures or videos of the birth. ? To eat during labor and delivery. ? To move around, walk, or change positions during labor and delivery.  What to expect after your baby is born, such as: ? Whether delayed umbilical cord clamping and cutting is offered. ? Who will care for your baby right after birth. ? Medicines or tests that may be recommended for your baby. ? Whether breastfeeding is supported in your hospital or birth center. ? How long you will be in the hospital or birth center.  How any medical conditions you have may affect your baby or your labor and delivery experience.  To prepare for your baby's birth, you should also:  Attend all of your health care visits before delivery (prenatal visits) as recommended by your health care provider. This is important.  Prepare your home for your baby's  arrival. Make sure that you have: ? Diapers. ? Baby clothing. ? Feeding equipment. ? Safe sleeping arrangements for you and your baby.  Install a car seat in your vehicle. Have your car seat checked by a certified car seat installer to make sure that it is installed safely.  Think about who will help you with your new baby at home for at least the first several weeks after delivery.  What can I expect when I arrive at the birth center or hospital? Once you are in labor and have been admitted into the hospital or birth center, your health care provider may:  Review your pregnancy history and any concerns you have.  Insert an IV tube into one of your veins. This is used to give you fluids and medicines.  Check your blood pressure, pulse, temperature, and heart rate (vital signs).  Check whether your bag of water (amniotic sac) has broken (ruptured).  Talk with you about your birth plan and discuss pain control options.  Monitoring Your health care provider may monitor your contractions (uterine monitoring) and your baby's heart rate (fetal monitoring). You may need to be monitored:  Often, but not continuously (intermittently).  All the time or for long periods at a time (continuously). Continuous monitoring may be needed if: ? You are taking certain medicines, such as medicine to relieve pain or make your contractions stronger. ? You have pregnancy or labor complications.  Monitoring may be done by:  Placing a special stethoscope or a handheld monitoring device on your abdomen to   check your baby's heartbeat, and feeling your abdomen for contractions. This method of monitoring does not continuously record your baby's heartbeat or your contractions.  Placing monitors on your abdomen (external monitors) to record your baby's heartbeat and the frequency and length of contractions. You may not have to wear external monitors all the time.  Placing monitors inside of your uterus  (internal monitors) to record your baby's heartbeat and the frequency, length, and strength of your contractions. ? Your health care provider may use internal monitors if he or she needs more information about the strength of your contractions or your baby's heart rate. ? Internal monitors are put in place by passing a thin, flexible wire through your vagina and into your uterus. Depending on the type of monitor, it may remain in your uterus or on your baby's head until birth. ? Your health care provider will discuss the benefits and risks of internal monitoring with you and will ask for your permission before inserting the monitors.  Telemetry. This is a type of continuous monitoring that can be done with external or internal monitors. Instead of having to stay in bed, you are able to move around during telemetry. Ask your health care provider if telemetry is an option for you.  Physical exam Your health care provider may perform a physical exam. This may include:  Checking whether your baby is positioned: ? With the head toward your vagina (head-down). This is most common. ? With the head toward the top of your uterus (head-up or breech). If your baby is in a breech position, your health care provider may try to turn your baby to a head-down position so you can deliver vaginally. If it does not seem that your baby can be born vaginally, your provider may recommend surgery to deliver your baby. In rare cases, you may be able to deliver vaginally if your baby is head-up (breech delivery). ? Lying sideways (transverse). Babies that are lying sideways cannot be delivered vaginally.  Checking your cervix to determine: ? Whether it is thinning out (effacing). ? Whether it is opening up (dilating). ? How low your baby has moved into your birth canal.  What are the three stages of labor and delivery?  Normal labor and delivery is divided into the following three stages: Stage 1  Stage 1 is the  longest stage of labor, and it can last for hours or days. Stage 1 includes: ? Early labor. This is when contractions may be irregular, or regular and mild. Generally, early labor contractions are more than 10 minutes apart. ? Active labor. This is when contractions get longer, more regular, more frequent, and more intense. ? The transition phase. This is when contractions happen very close together, are very intense, and may last longer than during any other part of labor.  Contractions generally feel mild, infrequent, and irregular at first. They get stronger, more frequent (about every 2-3 minutes), and more regular as you progress from early labor through active labor and transition.  Many women progress through stage 1 naturally, but you may need help to continue making progress. If this happens, your health care provider may talk with you about: ? Rupturing your amniotic sac if it has not ruptured yet. ? Giving you medicine to help make your contractions stronger and more frequent.  Stage 1 ends when your cervix is completely dilated to 4 inches (10 cm) and completely effaced. This happens at the end of the transition phase. Stage 2  Once   your cervix is completely effaced and dilated to 4 inches (10 cm), you may start to feel an urge to push. It is common for the body to naturally take a rest before feeling the urge to push, especially if you received an epidural or certain other pain medicines. This rest period may last for up to 1-2 hours, depending on your unique labor experience.  During stage 2, contractions are generally less painful, because pushing helps relieve contraction pain. Instead of contraction pain, you may feel stretching and burning pain, especially when the widest part of your baby's head passes through the vaginal opening (crowning).  Your health care provider will closely monitor your pushing progress and your baby's progress through the vagina during stage 2.  Your  health care provider may massage the area of skin between your vaginal opening and anus (perineum) or apply warm compresses to your perineum. This helps it stretch as the baby's head starts to crown, which can help prevent perineal tearing. ? In some cases, an incision may be made in your perineum (episiotomy) to allow the baby to pass through the vaginal opening. An episiotomy helps to make the opening of the vagina larger to allow more room for the baby to fit through.  It is very important to breathe and focus so your health care provider can control the delivery of your baby's head. Your health care provider may have you decrease the intensity of your pushing, to help prevent perineal tearing.  After delivery of your baby's head, the shoulders and the rest of the body generally deliver very quickly and without difficulty.  Once your baby is delivered, the umbilical cord may be cut right away, or this may be delayed for 1-2 minutes, depending on your baby's health. This may vary among health care providers, hospitals, and birth centers.  If you and your baby are healthy enough, your baby may be placed on your chest or abdomen to help maintain the baby's temperature and to help you bond with each other. Some mothers and babies start breastfeeding at this time. Your health care team will dry your baby and help keep your baby warm during this time.  Your baby may need immediate care if he or she: ? Showed signs of distress during labor. ? Has a medical condition. ? Was born too early (prematurely). ? Had a bowel movement before birth (meconium). ? Shows signs of difficulty transitioning from being inside the uterus to being outside of the uterus. If you are planning to breastfeed, your health care team will help you begin a feeding. Stage 3  The third stage of labor starts immediately after the birth of your baby and ends after you deliver the placenta. The placenta is an organ that develops  during pregnancy to provide oxygen and nutrients to your baby in the womb.  Delivering the placenta may require some pushing, and you may have mild contractions. Breastfeeding can stimulate contractions to help you deliver the placenta.  After the placenta is delivered, your uterus should tighten (contract) and become firm. This helps to stop bleeding in your uterus. To help your uterus contract and to control bleeding, your health care provider may: ? Give you medicine by injection, through an IV tube, by mouth, or through your rectum (rectally). ? Massage your abdomen or perform a vaginal exam to remove any blood clots that are left in your uterus. ? Empty your bladder by placing a thin, flexible tube (catheter) into your bladder. ? Encourage   you to breastfeed your baby. After labor is over, you and your baby will be monitored closely to ensure that you are both healthy until you are ready to go home. Your health care team will teach you how to care for yourself and your baby. This information is not intended to replace advice given to you by your health care provider. Make sure you discuss any questions you have with your health care provider. Document Released: 02/29/2008 Document Revised: 12/10/2015 Document Reviewed: 06/06/2015 Elsevier Interactive Patient Education  2018 [employer]  

## 2017-01-18 NOTE — Progress Notes (Signed)
ROB-Pt doing well, reports increased vaginal discharge. Discussed leukorrhea of pregnancy. 36 week cultures collected. Repeat CBC today due to anemia requiring iron supplementation. Reviewed red flag symptoms and when to call. RTC x 1 week for ROB or sooner if needed.

## 2017-01-19 ENCOUNTER — Other Ambulatory Visit: Payer: Self-pay | Admitting: Certified Nurse Midwife

## 2017-01-19 DIAGNOSIS — O99019 Anemia complicating pregnancy, unspecified trimester: Secondary | ICD-10-CM

## 2017-01-19 LAB — CBC
HEMATOCRIT: 27.6 % — AB (ref 34.0–46.6)
HEMOGLOBIN: 8.5 g/dL — AB (ref 11.1–15.9)
MCH: 21.3 pg — AB (ref 26.6–33.0)
MCHC: 30.8 g/dL — AB (ref 31.5–35.7)
MCV: 69 fL — AB (ref 79–97)
Platelets: 159 10*3/uL (ref 150–379)
RBC: 3.99 x10E6/uL (ref 3.77–5.28)
RDW: 17.5 % — AB (ref 12.3–15.4)
WBC: 4.3 10*3/uL (ref 3.4–10.8)

## 2017-01-19 NOTE — Progress Notes (Signed)
Please contact patient, needs folic acid and vitamin b12 level checked due to anemia. Unable to draw from collected blood, needs new draw. May come to office or at [facility]. Orders are in. Thanks, JML

## 2017-01-20 LAB — STREP GP B NAA+RFLX: STREP GP B NAA+RFLX: NEGATIVE

## 2017-01-21 LAB — GC/CHLAMYDIA PROBE AMP
CHLAMYDIA, DNA PROBE: NEGATIVE
NEISSERIA GONORRHOEAE BY PCR: NEGATIVE

## 2017-01-22 NOTE — Telephone Encounter (Signed)
Patient can only come during lunch for labs @ 1230 - lab appointments not available - per patient she will have it done @ her appointment on Thursday

## 2017-01-24 ENCOUNTER — Other Ambulatory Visit: Payer: Self-pay | Admitting: Certified Nurse Midwife

## 2017-01-24 ENCOUNTER — Ambulatory Visit (INDEPENDENT_AMBULATORY_CARE_PROVIDER_SITE_OTHER): Payer: 59 | Admitting: Certified Nurse Midwife

## 2017-01-24 ENCOUNTER — Other Ambulatory Visit: Payer: 59

## 2017-01-24 VITALS — BP 111/75 | HR 75 | Wt 171.0 lb

## 2017-01-24 DIAGNOSIS — Z3493 Encounter for supervision of normal pregnancy, unspecified, third trimester: Secondary | ICD-10-CM

## 2017-01-24 DIAGNOSIS — D649 Anemia, unspecified: Secondary | ICD-10-CM

## 2017-01-24 DIAGNOSIS — O99019 Anemia complicating pregnancy, unspecified trimester: Secondary | ICD-10-CM

## 2017-01-24 LAB — POCT URINALYSIS DIPSTICK
Bilirubin, UA: NEGATIVE
Blood, UA: NEGATIVE
Glucose, UA: NEGATIVE
KETONES UA: NEGATIVE
LEUKOCYTES UA: NEGATIVE
NITRITE UA: NEGATIVE
PH UA: 6 (ref 5.0–8.0)
Spec Grav, UA: 1.01 (ref 1.010–1.025)
UROBILINOGEN UA: 0.2 U/dL

## 2017-01-24 NOTE — Patient Instructions (Signed)
Pregnancy and Anemia Anemia is a condition in which the concentration of red blood cells or hemoglobin in the blood is below normal. Hemoglobin is a substance in red blood cells that carries oxygen to the tissues of the body. Anemia results in not enough oxygen reaching these tissues. Anemia during pregnancy is common because the fetus uses more iron and folic acid as it is developing. Your body may not produce enough red blood cells because of this. Also, during pregnancy, the liquid part of the blood (plasma) increases by about 50%, and the red blood cells increase by only 25%. This lowers the concentration of the red blood cells and creates a natural anemia-like situation. What are the causes? The most common cause of anemia during pregnancy is not having enough iron in the body to make red blood cells (iron deficiency anemia). Other causes may include:  Folic acid deficiency.  Vitamin B12 deficiency.  Certain prescription or over-the-counter medicines.  Certain medical conditions or infections that destroy red blood cells.  A low platelet count and bleeding caused by antibodies that go through the placenta to the fetus from the mother's blood. What are the signs or symptoms? Mild anemia may not be noticeable. If it becomes severe, symptoms may include:  Tiredness.  Shortness of breath, especially with exercise.  Weakness.  Fainting.  Pale looking skin.  Headaches.  Feeling a fast or irregular heartbeat (palpitations). How is this diagnosed? The type of anemia is usually diagnosed from your family and medical history and blood tests. How is this treated? Treatment of anemia during pregnancy depends on the cause of the anemia. Treatment can include:  Supplements of iron, vitamin B12, or folic acid.  A blood transfusion. This may be needed if blood loss is severe.  Hospitalization. This may be needed if there is significant continual blood loss.  Dietary changes. Follow  these instructions at home:  Follow your dietitian's or health care provider's dietary recommendations.  Increase your vitamin C intake. This will help the stomach absorb more iron.  Eat a diet rich in iron. This would include foods such as:  Liver.  Beef.  Whole grain bread.  Eggs.  Dried fruit.  Take iron and vitamins as directed by your health care provider.  Eat green leafy vegetables. These are a good source of folic acid. Contact a health care provider if:  You have frequent or lasting headaches.  You are looking pale.  You are bruising easily. Get help right away if:  You have extreme weakness, shortness of breath, or chest pain.  You become dizzy or have trouble concentrating.  You have heavy vaginal bleeding.  You develop a rash.  You have bloody or black, tarry stools.  You faint.  You vomit up blood.  You vomit repeatedly.  You have abdominal pain.  You have a fever or persistent symptoms for more than 2-3 days.  You have a fever and your symptoms suddenly get worse.  You are dehydrated. This information is not intended to replace advice given to you by your health care provider. Make sure you discuss any questions you have with your health care provider. Document Released: 05/19/2000 Document Revised: 10/28/2015 Document Reviewed: 01/01/2013 Elsevier Interactive Patient Education  2017 Elsevier Inc.  

## 2017-01-24 NOTE — Progress Notes (Signed)
ROB doing well, No cOmplaints. Labs: folate & B 12 today. Encouraged pt to take iron 2 x day in am and pm on empty stomach with OJ. Will follow up with results and advise of next steps. Follow up 1 wks for ROB.  Doreene Burke, CNM

## 2017-01-25 ENCOUNTER — Other Ambulatory Visit: Payer: Self-pay | Admitting: Certified Nurse Midwife

## 2017-01-25 DIAGNOSIS — O99019 Anemia complicating pregnancy, unspecified trimester: Secondary | ICD-10-CM

## 2017-01-25 LAB — FOLATE

## 2017-01-25 LAB — VITAMIN B12: VITAMIN B 12: 285 pg/mL (ref 232–1245)

## 2017-01-26 ENCOUNTER — Encounter: Payer: 59 | Admitting: Certified Nurse Midwife

## 2017-01-30 ENCOUNTER — Ambulatory Visit (INDEPENDENT_AMBULATORY_CARE_PROVIDER_SITE_OTHER): Payer: 59 | Admitting: Certified Nurse Midwife

## 2017-01-30 ENCOUNTER — Encounter: Payer: Self-pay | Admitting: Certified Nurse Midwife

## 2017-01-30 VITALS — BP 117/77 | HR 73 | Wt 172.9 lb

## 2017-01-30 DIAGNOSIS — O99013 Anemia complicating pregnancy, third trimester: Secondary | ICD-10-CM

## 2017-01-30 DIAGNOSIS — Z3493 Encounter for supervision of normal pregnancy, unspecified, third trimester: Secondary | ICD-10-CM

## 2017-01-30 LAB — POCT URINALYSIS DIPSTICK
BILIRUBIN UA: NEGATIVE
Blood, UA: NEGATIVE
Glucose, UA: NEGATIVE
KETONES UA: NEGATIVE
Nitrite, UA: NEGATIVE
Protein, UA: NEGATIVE
SPEC GRAV UA: 1.015 (ref 1.010–1.025)
Urobilinogen, UA: 0.2 E.U./dL
pH, UA: 6 (ref 5.0–8.0)

## 2017-01-30 NOTE — Progress Notes (Signed)
ROB-Reports increased pelvic pressure, back pain, and vaginal fullness. Education regarding home treatment measures. Increased iron supplementation to TID. Will collect repeat CBC and Ferrritin next visit. Reviewed red flag symptoms and when to call. RTC x 1 week or sooner if needed.

## 2017-01-30 NOTE — Patient Instructions (Signed)
Pregnancy and Anemia Anemia is a condition in which the concentration of red blood cells or hemoglobin in the blood is below normal. Hemoglobin is a substance in red blood cells that carries oxygen to the tissues of the body. Anemia results in not enough oxygen reaching these tissues. Anemia during pregnancy is common because the fetus uses more iron and folic acid as it is developing. Your body may not produce enough red blood cells because of this. Also, during pregnancy, the liquid part of the blood (plasma) increases by about 50%, and the red blood cells increase by only 25%. This lowers the concentration of the red blood cells and creates a natural anemia-like situation. What are the causes? The most common cause of anemia during pregnancy is not having enough iron in the body to make red blood cells (iron deficiency anemia). Other causes may include:  Folic acid deficiency.  Vitamin B12 deficiency.  Certain prescription or over-the-counter medicines.  Certain medical conditions or infections that destroy red blood cells.  A low platelet count and bleeding caused by antibodies that go through the placenta to the fetus from the mother's blood. What are the signs or symptoms? Mild anemia may not be noticeable. If it becomes severe, symptoms may include:  Tiredness.  Shortness of breath, especially with exercise.  Weakness.  Fainting.  Pale looking skin.  Headaches.  Feeling a fast or irregular heartbeat (palpitations). How is this diagnosed? The type of anemia is usually diagnosed from your family and medical history and blood tests. How is this treated? Treatment of anemia during pregnancy depends on the cause of the anemia. Treatment can include:  Supplements of iron, vitamin B12, or folic acid.  A blood transfusion. This may be needed if blood loss is severe.  Hospitalization. This may be needed if there is significant continual blood loss.  Dietary changes. Follow  these instructions at home:  Follow your dietitian's or health care provider's dietary recommendations.  Increase your vitamin C intake. This will help the stomach absorb more iron.  Eat a diet rich in iron. This would include foods such as:  Liver.  Beef.  Whole grain bread.  Eggs.  Dried fruit.  Take iron and vitamins as directed by your health care provider.  Eat green leafy vegetables. These are a good source of folic acid. Contact a health care provider if:  You have frequent or lasting headaches.  You are looking pale.  You are bruising easily. Get help right away if:  You have extreme weakness, shortness of breath, or chest pain.  You become dizzy or have trouble concentrating.  You have heavy vaginal bleeding.  You develop a rash.  You have bloody or black, tarry stools.  You faint.  You vomit up blood.  You vomit repeatedly.  You have abdominal pain.  You have a fever or persistent symptoms for more than 2-3 days.  You have a fever and your symptoms suddenly get worse.  You are dehydrated. This information is not intended to replace advice given to you by your health care provider. Make sure you discuss any questions you have with your health care provider. Document Released: 05/19/2000 Document Revised: 10/28/2015 Document Reviewed: 01/01/2013 Elsevier Interactive Patient Education  2017 Elsevier Inc.  

## 2017-02-02 ENCOUNTER — Inpatient Hospital Stay
Admission: EM | Admit: 2017-02-02 | Discharge: 2017-02-04 | DRG: 775 | Disposition: A | Discharge status: Home / Self Care | Payer: 59 | Attending: Obstetrics and Gynecology | Admitting: Obstetrics and Gynecology

## 2017-02-02 ENCOUNTER — Encounter: Payer: Self-pay | Admitting: Certified Nurse Midwife

## 2017-02-02 DIAGNOSIS — O9902 Anemia complicating childbirth: Principal | ICD-10-CM | POA: Diagnosis present

## 2017-02-02 DIAGNOSIS — Z3A38 38 weeks gestation of pregnancy: Secondary | ICD-10-CM | POA: Diagnosis not present

## 2017-02-02 DIAGNOSIS — D649 Anemia, unspecified: Secondary | ICD-10-CM | POA: Diagnosis not present

## 2017-02-02 DIAGNOSIS — Z3493 Encounter for supervision of normal pregnancy, unspecified, third trimester: Secondary | ICD-10-CM | POA: Diagnosis present

## 2017-02-02 LAB — TYPE AND SCREEN
ABO/RH(D): O POS
Antibody Screen: NEGATIVE

## 2017-02-02 LAB — CBC
HCT: 28.4 % — ABNORMAL LOW (ref 35.0–47.0)
HEMOGLOBIN: 9 g/dL — AB (ref 12.0–16.0)
MCH: 21.7 pg — AB (ref 26.0–34.0)
MCHC: 31.9 g/dL — AB (ref 32.0–36.0)
MCV: 68.1 fL — AB (ref 80.0–100.0)
PLATELETS: 180 10*3/uL (ref 150–440)
RBC: 4.16 MIL/uL (ref 3.80–5.20)
RDW: 19 % — ABNORMAL HIGH (ref 11.5–14.5)
WBC: 7.7 10*3/uL (ref 3.6–11.0)

## 2017-02-02 MED ORDER — OXYTOCIN 40 UNITS IN LACTATED RINGERS INFUSION - SIMPLE MED
INTRAVENOUS | Status: AC
  Filled 2017-02-02: qty 1000

## 2017-02-02 MED ORDER — ONDANSETRON HCL 4 MG PO TABS: 4.0000 mg | ORAL_TABLET | ORAL | Status: DC | PRN

## 2017-02-02 MED ORDER — METHYLERGONOVINE MALEATE 0.2 MG PO TABS
0.2000 mg | ORAL_TABLET | ORAL | Status: DC | PRN
  Filled 2017-02-02: qty 1

## 2017-02-02 MED ORDER — METHYLERGONOVINE MALEATE 0.2 MG/ML IJ SOLN: 0.2000 mg | INTRAMUSCULAR | Status: DC | PRN

## 2017-02-02 MED ORDER — BUTORPHANOL TARTRATE 1 MG/ML IJ SOLN: 1.0000 mg | INTRAMUSCULAR | Status: DC | PRN

## 2017-02-02 MED ORDER — PRENATAL MULTIVITAMIN CH
1.0000 | ORAL_TABLET | Freq: Every day | ORAL | Status: DC
  Administered 2017-02-03 – 2017-02-04 (×2): 1 via ORAL
  Filled 2017-02-02 (×2): qty 1

## 2017-02-02 MED ORDER — OXYTOCIN 10 UNIT/ML IJ SOLN
INTRAMUSCULAR | Status: AC
  Filled 2017-02-02: qty 2

## 2017-02-02 MED ORDER — SOD CITRATE-CITRIC ACID 500-334 MG/5ML PO SOLN: 30.0000 mL | ORAL | Status: DC | PRN

## 2017-02-02 MED ORDER — ACETAMINOPHEN 325 MG PO TABS
650.0000 mg | ORAL_TABLET | ORAL | Status: DC | PRN
  Administered 2017-02-02 – 2017-02-04 (×5): 650 mg via ORAL
  Filled 2017-02-02 (×5): qty 2

## 2017-02-02 MED ORDER — DIPHENHYDRAMINE HCL 25 MG PO CAPS: 25.0000 mg | ORAL_CAPSULE | Freq: Four times a day (QID) | ORAL | Status: DC | PRN

## 2017-02-02 MED ORDER — ONDANSETRON HCL 4 MG/2ML IJ SOLN: 4.0000 mg | Freq: Four times a day (QID) | INTRAMUSCULAR | Status: DC | PRN

## 2017-02-02 MED ORDER — OXYTOCIN BOLUS FROM INFUSION
500.0000 mL | Freq: Once | INTRAVENOUS | Status: AC
  Administered 2017-02-02: 500 mL via INTRAVENOUS

## 2017-02-02 MED ORDER — WITCH HAZEL-GLYCERIN EX PADS: 1.0000 "application " | MEDICATED_PAD | CUTANEOUS | Status: DC | PRN

## 2017-02-02 MED ORDER — LIDOCAINE HCL (PF) 1 % IJ SOLN: 30.0000 mL | INTRAMUSCULAR | Status: DC | PRN

## 2017-02-02 MED ORDER — MISOPROSTOL 200 MCG PO TABS
ORAL_TABLET | ORAL | Status: AC
  Filled 2017-02-02: qty 4

## 2017-02-02 MED ORDER — COCONUT OIL OIL
1.0000 "application " | TOPICAL_OIL | Status: DC | PRN
  Filled 2017-02-02: qty 120

## 2017-02-02 MED ORDER — OXYCODONE-ACETAMINOPHEN 5-325 MG PO TABS: 1.0000 | ORAL_TABLET | ORAL | Status: DC | PRN

## 2017-02-02 MED ORDER — SENNOSIDES-DOCUSATE SODIUM 8.6-50 MG PO TABS
2.0000 | ORAL_TABLET | ORAL | Status: DC
  Administered 2017-02-03 – 2017-02-04 (×2): 2 via ORAL
  Filled 2017-02-02 (×2): qty 2

## 2017-02-02 MED ORDER — SIMETHICONE 80 MG PO CHEW: 80.0000 mg | CHEWABLE_TABLET | ORAL | Status: DC | PRN

## 2017-02-02 MED ORDER — LACTATED RINGERS IV SOLN
500.0000 mL | INTRAVENOUS | Status: DC | PRN
  Administered 2017-02-02: 125 mL via INTRAVENOUS

## 2017-02-02 MED ORDER — DIBUCAINE 1 % RE OINT: 1.0000 "application " | TOPICAL_OINTMENT | RECTAL | Status: DC | PRN

## 2017-02-02 MED ORDER — OXYTOCIN 40 UNITS IN LACTATED RINGERS INFUSION - SIMPLE MED: 2.5000 [IU]/h | INTRAVENOUS | Status: DC

## 2017-02-02 MED ORDER — AMMONIA AROMATIC IN INHA
RESPIRATORY_TRACT | Status: AC
  Filled 2017-02-02: qty 10

## 2017-02-02 MED ORDER — FERROUS SULFATE 325 (65 FE) MG PO TABS
325.0000 mg | ORAL_TABLET | Freq: Every day | ORAL | Status: DC
  Administered 2017-02-04: 325 mg via ORAL
  Filled 2017-02-02: qty 1

## 2017-02-02 MED ORDER — LIDOCAINE HCL (PF) 1 % IJ SOLN
INTRAMUSCULAR | Status: AC
  Filled 2017-02-02: qty 30

## 2017-02-02 MED ORDER — ONDANSETRON HCL 4 MG/2ML IJ SOLN: 4.0000 mg | INTRAMUSCULAR | Status: DC | PRN

## 2017-02-02 MED ORDER — DOCUSATE SODIUM 100 MG PO CAPS
100.0000 mg | ORAL_CAPSULE | Freq: Two times a day (BID) | ORAL | Status: DC
  Administered 2017-02-02 – 2017-02-04 (×4): 100 mg via ORAL
  Filled 2017-02-02 (×4): qty 1

## 2017-02-02 MED ORDER — OXYCODONE-ACETAMINOPHEN 5-325 MG PO TABS: 2.0000 | ORAL_TABLET | ORAL | Status: DC | PRN

## 2017-02-02 MED ORDER — BENZOCAINE-MENTHOL 20-0.5 % EX AERO: 1.0000 "application " | INHALATION_SPRAY | CUTANEOUS | Status: DC | PRN

## 2017-02-02 NOTE — Progress Notes (Signed)
Pt arrived to BP triage with c/o worsening painful contractions since 10p at which time they were coming every 10 mins, G2P1, says contractions are now coming every 2-5 mins for the past since 0500a, confirms +FM, report nausea, no vomiting. Denies spotting, vaginal bleeding, leaking or gush of fluid. Tues cervix was 2 cm. GBS neg.

## 2017-02-02 NOTE — H&P (Signed)
History and Physical   HPI  Kristine King is a 26 y.o. G2P1001 at [redacted]w[redacted]d Estimated Date of Delivery: 02/14/17 who is being admitted for labor management. She presented this morning for strong regular contractions. She is breathing well with contractions. She denies ROM , vaginal bleeding and endorses good fetal movement.    OB History  Obstetric History   G2   P1   T1   P0   A0   L1    SAB0   TAB0   Ectopic0   Multiple0   Live Births1     # Outcome Date GA Lbr Len/2nd Weight Sex Delivery Anes PTL Lv  2 Current           1 Term 12/16/09   6 lb 10 oz (3.005 kg) F Vag-Spont  N LIV      PROBLEM LIST  Pregnancy complications or risks: Patient Active Problem List   Diagnosis Date Noted  . Labor and delivery, indication for care 02/02/2017  . Pregnancy 09/29/2016  . 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 Acne 03/15/2014    Prenatal labs and studies: ABO, Rh: O/Positive/-- (01/26 1052) Antibody: Negative (01/26 1052) Rubella: 2.82 (01/26 1052) RPR: Non Reactive (01/26 1052)  HBsAg: Negative (01/26 1052)  HIV:   non reactive GBS: Negative 1 hr Glucola  110 Genetic screening normal Anatomy US The fetal right renal pelvis today measures: 4.6 mm ( abnormal for  3rd trimester is >7 mm) (11/29/16)  Prenatal Transfer Tool  Maternal Diabetes: No Genetic Screening: Normal Maternal Ultrasounds/Referrals: None Fetal Ultrasounds or other Referrals:  None Maternal Substance Abuse:  No Significant Maternal Medications:  None Significant Maternal Lab Results: Lab values include: Other: Anemia CBC o8/16/18 Hemoglobin 8.5g/dL, Hematocrit 09.8%    Past Medical History:  Diagnosis Date  . Anemia      Past Surgical History:  Procedure Laterality Date  . none       Medications    Current Discharge Medication List    CONTINUE these medications which have NOT CHANGED   Details  ferrous sulfate 325 (65 FE) MG tablet Take 325 mg by mouth 3 (three) times daily with meals.     Prenatal Vit-Fe Fumarate-FA (PRENATAL MULTIVITAMIN) TABS tablet Take 1 tablet by mouth daily at 12 noon.   Associated Diagnoses: Encounter for supervision of normal pregnancy in multigravida in first trimester         Allergies  Meloxicam and Penicillin g  Review of Systems  Constitutional: negative Eyes: negative Ears, nose, mouth, throat, and face: negative Respiratory: negative Cardiovascular: negative Gastrointestinal: negative Genitourinary:negative Integument/breast: negative Hematologic/lymphatic: negative Musculoskeletal:negative Neurological: negative Behavioral/Psych: negative Endocrine: negative Allergic/Immunologic: negative  Physical Exam  BP 126/76   Pulse 74   Temp 98.1 F (36.7 C) (Oral)   Ht 5\' 2"  (1.575 m)   Wt 172 lb (78 kg)   LMP 05/01/2016 (Approximate)   BMI 31.46 kg/m   Lungs:  CTA B Cardio: RRR  Abd: Soft, gravid, NT Presentation: cephalic EXT: No C/C/ 1+ Edema DTRs: 2+ B CERVIX: 7 cm: per RN See Prenatal records for more detailed PE.     FHR:  Baseline: 125 bpm, Variability: Good {> 6 bpm), Accelerations: Reactive and Decelerations: Absent  Toco: Uterine Contractions: Frequency: Every 3-5 minutes, Duration: 50-90 seconds and Intensity: strong   Test Results  Results for orders placed or performed during the hospital encounter of 02/02/17 (from the past 24 hour(s))  Type and screen [facility]  Status: None   Collection Time: 02/02/17  7:29 AM  Result Value Ref Range   ABO/RH(D) O POS    Antibody Screen NEG    Sample Expiration 02/05/2017   CBC     Status: Abnormal   Collection Time: 02/02/17  7:29 AM  Result Value Ref Range   WBC 7.7 3.6 - 11.0 K/uL   RBC 4.16 3.80 - 5.20 MIL/uL   Hemoglobin 9.0 (L) 12.0 - 16.0 g/dL   HCT 16.128.4 (L) 09.635.0 - 04.547.0 %   MCV 68.1 (L) 80.0 - 100.0 fL   MCH 21.7 (L) 26.0 - 34.0 pg   MCHC 31.9 (L) 32.0 - 36.0 g/dL   RDW 40.919.0 (H) 81.111.5 - 91.414.5 %   Platelets 180 150 -  440 K/uL   Group B Strep negative  Assessment   G2P1001 at 3742w2d Estimated Date of Delivery: 02/14/17  The fetus is reassuring. Category 1   Patient Active Problem List   Diagnosis Date Noted  . Labor and delivery, indication for care 02/02/2017  . Pregnancy 09/29/2016  . 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 Acne 03/15/2014    Plan  1. Admit to L&D for expectant management 2. EFM- Category 1 3. Epidural if desired. Stadol for IV pain until epidural requested. 4. Admission labs  5. Anticipate NSVD  Doreene Burkennie Kwaku Mostafa, CNM 02/02/2017 7:51 AM

## 2017-02-03 LAB — CBC
HCT: 23 % — ABNORMAL LOW (ref 35.0–47.0)
HEMOGLOBIN: 7.2 g/dL — AB (ref 12.0–16.0)
MCH: 21.6 pg — ABNORMAL LOW (ref 26.0–34.0)
MCHC: 31.2 g/dL — ABNORMAL LOW (ref 32.0–36.0)
MCV: 69.3 fL — ABNORMAL LOW (ref 80.0–100.0)
PLATELETS: 137 10*3/uL — AB (ref 150–440)
RBC: 3.33 MIL/uL — AB (ref 3.80–5.20)
RDW: 18.9 % — ABNORMAL HIGH (ref 11.5–14.5)
WBC: 10.8 10*3/uL (ref 3.6–11.0)

## 2017-02-03 LAB — RPR: RPR: NONREACTIVE

## 2017-02-03 NOTE — Progress Notes (Signed)
Progress Note - Vaginal Delivery  Kristine King is a 26 y.o. G2P1001 now PP day 1 s/p Vaginal, Spontaneous Delivery .   Subjective:  The patient reports no complaints, up ad lib, voiding and tolerating PO  Objective:  Vital signs in last 24 hours: Temp:  [97.9 F (36.6 C)-99 F (37.2 C)] 97.9 F (36.6 C) (09/01 0743) Pulse Rate:  [67-83] 67 (09/01 0743) Resp:  [12-17] 17 (09/01 0743) BP: (108-122)/(56-76) 122/72 (09/01 0743) SpO2:  [100 %] 100 % (09/01 0743)  Physical Exam:  General: alert, cooperative, appears stated age and no distress  Lungs: Clear bilaterally Heart: RRR  Bowel sounds present Lochia: appropriate Uterine Fundus: firm DVT Evaluation: No evidence of DVT seen on physical exam. Negative Homan's sign. No cords or calf tenderness. No significant calf/ankle edema.    Data Review  Recent Labs  02/02/17 0729 02/03/17 0603  HGB 9.0* 7.2*  HCT 28.4* 23.0*    Assessment/Plan: Active Problems:   Labor and delivery, indication for care   Plan for discharge tomorrow  -- Continue routine PP care.     Kristine King, CNM 02/03/2017 10:47 AM

## 2017-02-04 MED ORDER — DOCUSATE SODIUM 100 MG PO CAPS: 100.0000 mg | ORAL_CAPSULE | Freq: Two times a day (BID) | ORAL | 0 refills | Status: DC

## 2017-02-04 NOTE — Final Progress Note (Signed)
Discharge Day SOAP Note:  Progress Note - Vaginal Delivery  Kristine King is a 26 y.o. G2P1001 now PP day 2 s/p Vaginal, Spontaneous Delivery . Delivery was uncomplicated  Subjective  The patient has the following complaints: has no unusual complaints  Pain is controlled with current medications.   Patient is urinating without difficulty.  She is ambulating well.    Objective  Vital signs: BP 114/73 (BP Location: Right Arm)   Pulse 70   Temp 98.7 F (37.1 C) (Oral)   Resp 18   Ht 5\' 2"  (1.575 m)   Wt 172 lb (78 kg)   LMP 05/01/2016 (Approximate)   SpO2 100%   BMI 31.46 kg/m   Physical Exam: Gen: NAD Fundus Fundal Tone: Firm @ 2  Lochia Amount: Small  Perineum Appearance: Intact     Data Review Labs: CBC Latest Ref Rng & Units 02/03/2017 02/02/2017 01/18/2017  WBC 3.6 - 11.0 K/uL 10.8 7.7 4.3  Hemoglobin 12.0 - 16.0 g/dL 7.2(L) 9.0(L) 8.5(L)  Hematocrit 35.0 - 47.0 % 23.0(L) 28.4(L) 27.6(L)  Platelets 150 - 440 K/uL 137(L) 180 159   O POS  Assessment/Plan  Active Problems:   Labor and delivery, indication for care    Plan for discharge today.   Discharge Instructions: Per After Visit Summary. Activity: Advance as tolerated. Pelvic rest for 6 weeks.  Also refer to After Visit Summary Diet: Regular Medications: Allergies as of 02/04/2017      Reactions   Meloxicam Swelling   Penicillin G Hives      Medication List    TAKE these medications   docusate sodium 100 MG capsule Commonly known as:  COLACE Take 1 capsule (100 mg total) by mouth 2 (two) times daily.   ferrous sulfate 325 (65 FE) MG tablet Take 325 mg by mouth 3 (three) times daily with meals.   prenatal multivitamin Tabs tablet Take 1 tablet by mouth daily at 12 noon.            Discharge Care Instructions        Start     Ordered   02/04/17 0000  docusate sodium (COLACE) 100 MG capsule  2 times daily     02/04/17 0800     Outpatient follow up:  Postp artum contraception:  condoms  Discharged Condition: good  Discharged to: home  Newborn Data: Disposition:home with mother  Apgars: APGAR (1 MIN): 8   APGAR (5 MINS): 9   APGAR (10 MINS):    Baby Feeding: Breast    Kristine King, [facility] 02/04/2017

## 2017-02-04 NOTE — Progress Notes (Signed)
Patient discharged home with infant. Discharge instructions, prescriptions and follow up appointment given to and reviewed with patient. Patient verbalized understanding. Patient wheeled out with infant by tech.

## 2017-02-04 NOTE — Discharge Summary (Signed)
Discharge Summary  Date of Admission: 02/02/2017  Date of Discharge: 02/04/17  Admitting Diagnosis: Onset of Labor at 8857w4d  Secondary Diagnosis: Anemia in pregnancy  Mode of Delivery: normal spontaneous vaginal delivery     Discharge Diagnosis: No other diagnosis   Intrapartum Procedures: None   Post partum procedures: None  Complications: none                      Discharge Day SOAP Note:  Progress Note - Vaginal Delivery  Kristine King is a 26 y.o. G2P1001 now PP day 2 s/p Vaginal, Spontaneous Delivery . Delivery was uncomplicated  Subjective  The patient has the following complaints: has no unusual complaints  Pain is controlled with current medications.   Patient is urinating without difficulty.  She is ambulating well.    Objective  Vital signs: BP 114/73 (BP Location: Right Arm)   Pulse 70   Temp 98.7 F (37.1 C) (Oral)   Resp 18   Ht 5\' 2"  (1.575 m)   Wt 172 lb (78 kg)   LMP 05/01/2016 (Approximate)   SpO2 100%   BMI 31.46 kg/m   Physical Exam: Gen: NAD Fundus Fundal Tone: Firm @ 2  Lochia Amount: Small  Perineum Appearance: Intact     Data Review Labs: CBC Latest Ref Rng & Units 02/03/2017 02/02/2017 01/18/2017  WBC 3.6 - 11.0 K/uL 10.8 7.7 4.3  Hemoglobin 12.0 - 16.0 g/dL 7.2(L) 9.0(L) 8.5(L)  Hematocrit 35.0 - 47.0 % 23.0(L) 28.4(L) 27.6(L)  Platelets 150 - 440 K/uL 137(L) 180 159   O POS  Assessment/Plan  Active Problems:   Labor and delivery, indication for care    Plan for discharge today.   Discharge Instructions: Per After Visit Summary. Activity: Advance as tolerated. Pelvic rest for 6 weeks.  Also refer to After Visit Summary Diet: Regular Medications: Allergies as of 02/04/2017      Reactions   Meloxicam Swelling   Penicillin G Hives      Medication List    TAKE these medications   docusate sodium 100 MG capsule Commonly known as:  COLACE Take 1 capsule (100 mg total) by mouth 2 (two) times  daily.   ferrous sulfate 325 (65 FE) MG tablet Take 325 mg by mouth 3 (three) times daily with meals.   prenatal multivitamin Tabs tablet Take 1 tablet by mouth daily at 12 noon.            Discharge Care Instructions        Start     Ordered   02/04/17 0000  docusate sodium (COLACE) 100 MG capsule  2 times daily     02/04/17 0800     Outpatient follow up:  Postp artum contraception: condoms  Discharged Condition: good  Discharged to: home  Newborn Data: Disposition:home with mother  Apgars: APGAR (1 MIN): 8   APGAR (5 MINS): 9   APGAR (10 MINS):    Baby Feeding: Breast    Doreene Burkennie Joban Colledge, CNN 02/04/2017 8:01 AM

## 2017-02-06 ENCOUNTER — Encounter: Payer: 59 | Admitting: Certified Nurse Midwife

## 2017-02-12 ENCOUNTER — Inpatient Hospital Stay: Payer: 59

## 2017-02-12 ENCOUNTER — Encounter: Payer: Self-pay | Admitting: Oncology

## 2017-02-12 ENCOUNTER — Ambulatory Visit: Payer: Self-pay | Admitting: Oncology

## 2017-02-12 ENCOUNTER — Inpatient Hospital Stay: Payer: 59 | Attending: Oncology | Admitting: Oncology

## 2017-02-12 VITALS — BP 106/70 | HR 71 | Temp 96.7°F | Resp 18 | Ht 63.78 in | Wt 147.2 lb

## 2017-02-12 DIAGNOSIS — D509 Iron deficiency anemia, unspecified: Secondary | ICD-10-CM | POA: Diagnosis not present

## 2017-02-12 DIAGNOSIS — Z79899 Other long term (current) drug therapy: Secondary | ICD-10-CM | POA: Insufficient documentation

## 2017-02-12 DIAGNOSIS — Z880 Allergy status to penicillin: Secondary | ICD-10-CM | POA: Diagnosis not present

## 2017-02-12 DIAGNOSIS — O9903 Anemia complicating the puerperium: Secondary | ICD-10-CM | POA: Diagnosis present

## 2017-02-12 DIAGNOSIS — R5381 Other malaise: Secondary | ICD-10-CM | POA: Insufficient documentation

## 2017-02-12 DIAGNOSIS — R5383 Other fatigue: Secondary | ICD-10-CM | POA: Diagnosis not present

## 2017-02-12 LAB — CBC WITH DIFFERENTIAL/PLATELET
Basophils Absolute: 0.1 10*3/uL (ref 0–0.1)
Basophils Relative: 1 %
Eosinophils Absolute: 0.1 10*3/uL (ref 0–0.7)
Eosinophils Relative: 1 %
HCT: 33 % — ABNORMAL LOW (ref 35.0–47.0)
HEMOGLOBIN: 10.4 g/dL — AB (ref 12.0–16.0)
LYMPHS ABS: 1.3 10*3/uL (ref 1.0–3.6)
Lymphocytes Relative: 17 %
MCH: 23.4 pg — AB (ref 26.0–34.0)
MCHC: 31.4 g/dL — ABNORMAL LOW (ref 32.0–36.0)
MCV: 74.4 fL — AB (ref 80.0–100.0)
MONO ABS: 0.4 10*3/uL (ref 0.2–0.9)
MONOS PCT: 6 %
NEUTROS ABS: 5.7 10*3/uL (ref 1.4–6.5)
NEUTROS PCT: 75 %
PLATELETS: 313 10*3/uL (ref 150–440)
RBC: 4.44 MIL/uL (ref 3.80–5.20)
RDW: 27.7 % — AB (ref 11.5–14.5)
WBC: 7.6 10*3/uL (ref 3.6–11.0)

## 2017-02-12 NOTE — Progress Notes (Signed)
Hematology/Oncology Consult note [facility] [phone] [phone]  Patient Care Team: Patient, No Pcp Per as PCP - General (General Practice)   Name of the patient: Kristine King  [id number]  1991/03/07    Reason for referral- anemia during pregnancy   Referring physician- Dr. Jeralyn BennettLawhorn  Date of visit: 02/12/17   History of presenting illness- patient is a 26 year old female who is currently 2 weeks postpartum. She is G2P2L2. She had a normal spontaneous vaginal delivery on 02/02/17 at 38 weeks and 2 days. She has been referred to us for anemia. Recent labs on 02/03/2017 showed white count of 10.8, H&H of 7.2/23 with an MCV of 69.3 and a platelet count of 137. Recent B12 and folate from 01/24/2017 was within normal limits.  Patient's prior pregnancy was 7 years ago she had iron deficiency with that one but did not require any blood transfusions or IV iron. During her present pregnancy patient started taking oral iron about one month before delivery and just a week before delivery she was asked to increase her dose to 3 times a day. She is currently taking oral iron 3 times daily. She was having significant fatigue and headaches prior to her delivery but reports that currently her energy levels are getting better. She is also continuing to take prenatal vitamins. She does have some minimal vaginal bleeding which is expected postpartum. No family history of any blood disorders or anemia. She is currently breastfeeding  ECOG PS- 0  Pain scale- 0   Review of systems- Review of Systems  Constitutional: Positive for malaise/fatigue. Negative for chills, fever and weight loss.  HENT: Negative for congestion, ear discharge and nosebleeds.   Eyes: Negative for blurred vision.  Respiratory: Negative for cough, hemoptysis, sputum production, shortness of breath and wheezing.   Cardiovascular: Negative for chest pain, palpitations, orthopnea and  claudication.  Gastrointestinal: Negative for abdominal pain, blood in stool, constipation, diarrhea, heartburn, melena, nausea and vomiting.  Genitourinary: Negative for dysuria, flank pain, frequency, hematuria and urgency.  Musculoskeletal: Negative for back pain, joint pain and myalgias.  Skin: Negative for rash.  Neurological: Negative for dizziness, tingling, focal weakness, seizures, weakness and headaches.  Endo/Heme/Allergies: Does not bruise/bleed easily.  Psychiatric/Behavioral: Negative for depression and suicidal ideas. The patient does not have insomnia.     Allergies  Allergen Reactions  . Meloxicam Swelling  . Penicillin G Hives    Patient Active Problem List   Diagnosis Date Noted  . Labor and delivery, indication for care 02/02/2017  . Pregnancy 09/29/2016  . 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 Acne 03/15/2014     Past Medical History:  Diagnosis Date  . Allergy   . Anemia      Past Surgical History:  Procedure Laterality Date  . none      Social History   Social History  . Marital status: Married    Spouse name: Regan RakersRoberto  . Number of children: 1  . Years of education: N/A   Occupational History  . patient billiing    Social History Main Topics  . Smoking status: Never Smoker  . Smokeless tobacco: Never Used  . Alcohol use No  . Drug use: No  . Sexual activity: Yes    Partners: Male    Birth control/ protection: None   Other Topics Concern  . Not on file   Social History Narrative  . No narrative on file     Family History  Problem Relation Age  of Onset  . Diabetes Paternal Grandfather      Current Outpatient Prescriptions:  .  ferrous sulfate 325 (65 FE) MG tablet, Take 325 mg by mouth 3 (three) times daily with meals., Disp: , Rfl:  .  Prenatal Vit-Fe Fumarate-FA (PRENATAL MULTIVITAMIN) TABS tablet, Take 1 tablet by mouth daily at 12 noon., Disp: , Rfl:  .  docusate sodium (COLACE) 100 MG capsule, Take 1 capsule (100  mg total) by mouth 2 (two) times daily. (Patient not taking: Reported on 02/12/2017), Disp: 10 capsule, Rfl: 0   Physical exam:  Vitals:   02/12/17 1458  BP: 106/70  Pulse: 71  Resp: 18  Temp: (!) 96.7 F (35.9 C)  TempSrc: Tympanic  Weight: 147 lb 2.5 oz (66.7 kg)  Height: 5' 3.78" (1.62 m)   Physical Exam  Constitutional: She is oriented to person, place, and time and well-developed, well-nourished, and in no distress.  HENT:  Head: Normocephalic and atraumatic.  Eyes: Pupils are equal, round, and reactive to light. EOM are normal.  Neck: Normal range of motion.  Cardiovascular: Normal rate, regular rhythm and normal heart sounds.   Pulmonary/Chest: Effort normal and breath sounds normal.  Abdominal: Soft. Bowel sounds are normal.  Neurological: She is alert and oriented to person, place, and time.  Skin: Skin is warm and dry.       CMP Latest Ref Rng & Units 09/01/2013  Glucose 65 - 99 mg/dL 97  BUN 7 - 18 mg/dL 17  Creatinine 1.61 - 0.96 mg/dL 0.45  Sodium 409 - 811 mmol/L 139  Potassium 3.5 - 5.1 mmol/L 3.8  Chloride 98 - 107 mmol/L 107  CO2 21 - 32 mmol/L 29  Calcium 8.5 - 10.1 mg/dL 8.7  Total Protein 6.4 - 8.2 g/dL 9.1(Y)  Total Bilirubin 0.2 - 1.0 mg/dL 0.7  Alkaline Phos Unit/L 75  AST 15 - 37 Unit/L 18  ALT 12 - 78 U/L 18   CBC Latest Ref Rng & Units 02/03/2017  WBC 3.6 - 11.0 K/uL 10.8  Hemoglobin 12.0 - 16.0 g/dL 7.2(L)  Hematocrit 35.0 - 47.0 % 23.0(L)  Platelets 150 - 440 K/uL 137(L)     Assessment and plan- Patient is a 26 y.o. female with severe microcytic anemia likely secondary to iron deficiency  Recent labs indicate severe microcytic anemia likely due to iron deficiency in the peri-partum period. Today I will check cbc with diff, ferritin and iron studies. I will plan to give her 5 doses of venofer 200 mg IV twice a week- 5 doses in 3-4 weeks if she continues to have severe iron deficiency based on todays bloodwork. She is to continue oral iron  and prenatal vitamins at this time  I will decide about her RTC appointment based on todays labs   Thank you for this kind referral and the opportunity to participate in the care of this patient   Visit Diagnosis 1. Microcytic anemia   2. Iron deficiency anemia, unspecified iron deficiency anemia type     Dr. Owens Shark, MD, MPH [facility] Pager- [phone] 02/12/2017  3:23 PM

## 2017-02-12 NOTE — Progress Notes (Signed)
Pt  Here for new pt evaluation. S/p live vaginal birth of second child Aug 31. Pleasant  In NAD

## 2017-02-13 ENCOUNTER — Encounter: Payer: Self-pay | Admitting: Oncology

## 2017-02-13 ENCOUNTER — Telehealth: Payer: Self-pay | Admitting: *Deleted

## 2017-02-13 LAB — IRON AND TIBC
IRON: 40 ug/dL (ref 28–170)
SATURATION RATIOS: 7 % — AB (ref 10.4–31.8)
TIBC: 563 ug/dL — AB (ref 250–450)
UIBC: 523 ug/dL

## 2017-02-13 LAB — FERRITIN: FERRITIN: 14 ng/mL (ref 11–307)

## 2017-02-13 NOTE — Telephone Encounter (Signed)
Called patient to advise her that her HGB was up and that Dr.Rao wants her to take OTC Iron. She has an appt. In two months for Labs and MD. Patient verbalized understandin.

## 2017-02-19 ENCOUNTER — Telehealth: Payer: Self-pay | Admitting: Certified Nurse Midwife

## 2017-02-19 NOTE — Telephone Encounter (Signed)
Pt called to see if FMLA forms had been completed. Per the log at the front desk, the forms were dropped off on 02/02/17. Please advise. Thanks TNP

## 2017-02-19 NOTE — Telephone Encounter (Signed)
Pt aware forms faxed this am. Filed.

## 2017-03-16 ENCOUNTER — Other Ambulatory Visit: Payer: Self-pay | Admitting: Certified Nurse Midwife

## 2017-03-16 ENCOUNTER — Encounter: Payer: Self-pay | Admitting: Certified Nurse Midwife

## 2017-03-16 ENCOUNTER — Ambulatory Visit (INDEPENDENT_AMBULATORY_CARE_PROVIDER_SITE_OTHER): Payer: 59 | Admitting: Certified Nurse Midwife

## 2017-03-16 DIAGNOSIS — O99013 Anemia complicating pregnancy, third trimester: Secondary | ICD-10-CM

## 2017-03-16 DIAGNOSIS — N898 Other specified noninflammatory disorders of vagina: Secondary | ICD-10-CM

## 2017-03-16 DIAGNOSIS — Z3493 Encounter for supervision of normal pregnancy, unspecified, third trimester: Secondary | ICD-10-CM

## 2017-03-16 DIAGNOSIS — D509 Iron deficiency anemia, unspecified: Secondary | ICD-10-CM

## 2017-03-16 NOTE — Patient Instructions (Signed)
Preventive Care 18-39 Years, Female Preventive care refers to lifestyle choices and visits with your health care provider that can promote health and wellness. What does preventive care include?  A yearly physical exam. This is also called an annual well check.  Dental exams once or twice a year.  Routine eye exams. Ask your health care provider how often you should have your eyes checked.  Personal lifestyle choices, including: ? Daily care of your teeth and gums. ? Regular physical activity. ? Eating a healthy diet. ? Avoiding tobacco and drug use. ? Limiting alcohol use. ? Practicing safe sex. ? Taking vitamin and mineral supplements as recommended by your health care provider. What happens during an annual well check? The services and screenings done by your health care provider during your annual well check will depend on your age, overall health, lifestyle risk factors, and family history of disease. Counseling Your health care provider may ask you questions about your:  Alcohol use.  Tobacco use.  Drug use.  Emotional well-being.  Home and relationship well-being.  Sexual activity.  Eating habits.  Work and work Statistician.  Method of birth control.  Menstrual cycle.  Pregnancy history.  Screening You may have the following tests or measurements:  Height, weight, and BMI.  Diabetes screening. This is done by checking your blood sugar (glucose) after you have not eaten for a while (fasting).  Blood pressure.  Lipid and cholesterol levels. These may be checked every 5 years starting at age 58.  Skin check.  Hepatitis C blood test.  Hepatitis B blood test.  Sexually transmitted disease (STD) testing.  BRCA-related cancer screening. This may be done if you have a family history of breast, ovarian, tubal, or peritoneal cancers.  Pelvic exam and Pap test. This may be done every 3 years starting at age 76. Starting at age 66, this may be done every 5  years if you have a Pap test in combination with an HPV test.  Discuss your test results, treatment options, and if necessary, the need for more tests with your health care provider. Vaccines Your health care provider may recommend certain vaccines, such as:  Influenza vaccine. This is recommended every year.  Tetanus, diphtheria, and acellular pertussis (Tdap, Td) vaccine. You may need a Td booster every 10 years.  Varicella vaccine. You may need this if you have not been vaccinated.  HPV vaccine. If you are 54 or younger, you may need three doses over 6 months.  Measles, mumps, and rubella (MMR) vaccine. You may need at least one dose of MMR. You may also need a second dose.  Pneumococcal 13-valent conjugate (PCV13) vaccine. You may need this if you have certain conditions and were not previously vaccinated.  Pneumococcal polysaccharide (PPSV23) vaccine. You may need one or two doses if you smoke cigarettes or if you have certain conditions.  Meningococcal vaccine. One dose is recommended if you are age 83-21 years and a first-year college student living in a residence hall, or if you have one of several medical conditions. You may also need additional booster doses.  Hepatitis A vaccine. You may need this if you have certain conditions or if you travel or work in places where you may be exposed to hepatitis A.  Hepatitis B vaccine. You may need this if you have certain conditions or if you travel or work in places where you may be exposed to hepatitis B.  Haemophilus influenzae type b (Hib) vaccine. You may need this if  you have certain risk factors.  Talk to your health care provider about which screenings and vaccines you need and how often you need them. This information is not intended to replace advice given to you by your health care provider. Make sure you discuss any questions you have with your health care provider. Document Released: 07/18/2001 Document Revised: 02/09/2016  Document Reviewed: 03/23/2015 Elsevier Interactive Patient Education  2017 [employer].

## 2017-03-16 NOTE — Progress Notes (Signed)
Subjective:    Kristine King is a 26 y.o. [id number] Hispanic female who presents for a postpartum visit. She is 6 weeks postpartum following a spontaneous vaginal delivery at 38.2 gestational weeks. Anesthesia: epidural. I have fully reviewed the prenatal and intrapartum course. Postpartum course has been normal. Baby's course has been normal. Baby is feeding by breast. Bleeding no bleeding. Bowel function is normal. Bladder function is normal. Patient is not sexually active. Last sexual activity: prior to delivery. Contraception method is condoms. Postpartum depression screening: negative. Score 6.  Last pap 07/28/16 and was negative.  The following portions of the patient's history were reviewed and updated as appropriate: allergies, current medications, past medical history, past surgical history and problem list.  Review of Systems Pertinent items are noted in HPI.   Vitals:   03/16/17 1344  BP: (!) 85/56  Pulse: 60  Weight: 140 lb 4.8 oz (63.6 kg)  Height:  (1.6 m)   No LMP recorded.  Objective:   General:  alert, cooperative and no distress   Breasts:  deferred, no complaints  Lungs: clear to auscultation bilaterally  Heart:  regular rate and rhythm  Abdomen: soft, nontender   Vulva: normal  Vagina: normal vagina, greenish discharge no odor   Cervix:  closed  Corpus: Well-involuted  Adnexa:  Non-palpable  Rectal Exam: nohemorrhoids        Assessment:   Postpartum exam 6 wks s/p NSVD Breast feeding Depression screening negative Contraception counseling completed  Plan:  : condoms . Will follow up with lab results Follow up in: 1 year for annual eam or earlier if needed  Doreene Burke, CNM

## 2017-03-21 LAB — NUSWAB VAGINITIS PLUS (VG+)
Candida albicans, NAA: NEGATIVE
Candida glabrata, NAA: NEGATIVE
Chlamydia trachomatis, NAA: NEGATIVE
Neisseria gonorrhoeae, NAA: NEGATIVE
TRICH VAG BY NAA: NEGATIVE

## 2017-03-23 LAB — FERRITIN: Ferritin: 29 ng/mL (ref 15–150)

## 2017-03-23 LAB — CBC
Hematocrit: 39.7 % (ref 34.0–46.6)
Hemoglobin: 11.7 g/dL (ref 11.1–15.9)
MCH: 24.7 pg — ABNORMAL LOW (ref 26.6–33.0)
MCHC: 29.5 g/dL — AB (ref 31.5–35.7)
MCV: 84 fL (ref 79–97)
PLATELETS: 207 10*3/uL (ref 150–379)
RBC: 4.74 x10E6/uL (ref 3.77–5.28)
RDW: 23.3 % — ABNORMAL HIGH (ref 12.3–15.4)
WBC: 3.4 10*3/uL (ref 3.4–10.8)

## 2017-03-23 LAB — VITAMIN D 1,25 DIHYDROXY
VITAMIN D 1, 25 (OH) TOTAL: 37 pg/mL
Vitamin D2 1, 25 (OH)2: <10 pg/mL
Vitamin D3 1, 25 (OH)2: 37 pg/mL

## 2017-03-23 LAB — VITAMIN B12: Vitamin B-12: 593 pg/mL (ref 232–1245)

## 2017-04-16 ENCOUNTER — Ambulatory Visit: Payer: 59 | Admitting: Oncology

## 2017-04-16 ENCOUNTER — Other Ambulatory Visit: Payer: 59

## 2017-04-23 ENCOUNTER — Encounter: Payer: Self-pay | Admitting: Certified Nurse Midwife

## 2017-04-25 ENCOUNTER — Other Ambulatory Visit: Payer: Self-pay | Admitting: *Deleted

## 2017-04-25 ENCOUNTER — Encounter: Payer: Self-pay | Admitting: *Deleted

## 2018-11-20 ENCOUNTER — Ambulatory Visit (INDEPENDENT_AMBULATORY_CARE_PROVIDER_SITE_OTHER): Payer: Managed Care, Other (non HMO) | Admitting: Certified Nurse Midwife

## 2018-11-20 ENCOUNTER — Encounter: Payer: Self-pay | Admitting: Certified Nurse Midwife

## 2018-11-20 ENCOUNTER — Other Ambulatory Visit: Payer: Self-pay

## 2018-11-20 VITALS — BP 104/57 | HR 74 | Ht 63.0 in | Wt 133.6 lb

## 2018-11-20 DIAGNOSIS — N912 Amenorrhea, unspecified: Secondary | ICD-10-CM | POA: Diagnosis not present

## 2018-11-20 DIAGNOSIS — Z3481 Encounter for supervision of other normal pregnancy, first trimester: Secondary | ICD-10-CM

## 2018-11-20 LAB — POCT URINE PREGNANCY: Preg Test, Ur: POSITIVE — AB

## 2018-11-20 NOTE — Progress Notes (Signed)
Subjective:    Kristine King is a 28 y.o. female who presents for evaluation of amenorrhea. She believes she could be pregnant. Pregnancy is desired. Sexual Activity: single partner, contraception: none. Current symptoms also include: nausea. Last period was normal.   No LMP recorded. The following portions of the patient's history were reviewed and updated as appropriate: allergies, current medications, past family history, past medical history, past social history, past surgical history and problem list.  Review of Systems Pertinent items are noted in HPI.     Objective:    There were no vitals taken for this visit. General: alert, cooperative, appears stated age and no acute distress    Lab Review Urine HCG: positive    Assessment:    Absence of menstruation.     Plan:     Positive . Briefly discussed pre-natal care options. Pregnancy, Md or midwifery care.  Encouraged well-balanced diet, plenty of rest when needed, pre-natal vitamins daily and walking for exercise. Discussed self-help for nausea, avoiding OTC medications until consulting provider or pharmacist, other than Tylenol as needed, minimal caffeine (1-2 cups daily) and avoiding alcohol. She will schedule a dating u/s in 1 wk, nurse visit at 12 wks and NOB physical exam @ 12 wks.  Feel free to call with any questions.

## 2018-11-20 NOTE — Patient Instructions (Signed)

## 2018-12-03 ENCOUNTER — Telehealth: Payer: Self-pay

## 2018-12-03 NOTE — Telephone Encounter (Signed)
Coronavirus (COVID-19) Are you at risk?  Are you at risk for the Coronavirus (COVID-19)?  To be considered HIGH RISK for Coronavirus (COVID-19), you have to meet the following criteria:  . Traveled to [place], [place], [place], [place] or [place]; or in the [place] to [place], [place], [place], or [place]; and have fever, cough, and shortness of breath within the last 2 weeks of travel OR . Been in close contact with a person diagnosed with COVID-19 within the last 2 weeks and have fever, cough, and shortness of breath . IF YOU DO NOT MEET THESE CRITERIA, YOU ARE CONSIDERED LOW RISK FOR COVID-19.  What to do if you are HIGH RISK for COVID-19?  . If you are having a medical emergency, call 911. . Seek medical care right away. Before you go to a doctor's office, urgent care or emergency department, call ahead and tell them about your recent travel, contact with someone diagnosed with COVID-19, and your symptoms. You should receive instructions from your physician's office regarding next steps of care.  . When you arrive at healthcare provider, tell the healthcare staff immediately you have returned from visiting [place], [place], [place], [place] or [place]; or traveled in the [place] to [place], [place], [place], or [place]; in the last two weeks or you have been in close contact with a person diagnosed with COVID-19 in the last 2 weeks.   . Tell the health care staff about your symptoms: fever, cough and shortness of breath. . After you have been seen by a medical provider, you will be either: o Tested for (COVID-19) and discharged home on quarantine except to seek medical care if symptoms worsen, and asked to  - Stay home and avoid contact with others until you get your results (4-5 days)  - Avoid travel on public transportation if possible (such as bus, train, or airplane) or o Sent to the Emergency Department by EMS for evaluation, COVID-19 testing, and possible  admission depending on your condition and test results.  What to do if you are LOW RISK for COVID-19?  Reduce your risk of any infection by using the same precautions used for avoiding the common cold or flu:  . Wash your hands often with soap and warm water for at least 20 seconds.  If soap and water are not readily available, use an alcohol-based hand sanitizer with at least 60% alcohol.  . If coughing or sneezing, cover your mouth and nose by coughing or sneezing into the elbow areas of your shirt or coat, into a tissue or into your sleeve (not your hands). . Avoid shaking hands with others and consider head nods or verbal greetings only. . Avoid touching your eyes, nose, or mouth with unwashed hands.  . Avoid close contact with people who are sick. . Avoid places or events with large numbers of people in one location, like concerts or sporting events. . Carefully consider travel plans you have or are making. . If you are planning any travel outside or inside the [place], visit the CDC's Travelers' Health webpage for the latest health notices. . If you have some symptoms but not all symptoms, continue to monitor at home and seek medical attention if your symptoms worsen. . If you are having a medical emergency, call 911.   ADDITIONAL HEALTHCARE OPTIONS FOR PATIENTS  [facility] / e-Visit: [email or web address]         [facility]: [phone]  [facility]: [phone]                   [facility]: [phone]   Prescreened. Neg .cm 

## 2018-12-04 ENCOUNTER — Other Ambulatory Visit: Payer: Self-pay

## 2018-12-04 ENCOUNTER — Ambulatory Visit: Payer: Managed Care, Other (non HMO)

## 2018-12-04 DIAGNOSIS — Z3481 Encounter for supervision of other normal pregnancy, first trimester: Secondary | ICD-10-CM

## 2018-12-20 ENCOUNTER — Other Ambulatory Visit: Payer: Self-pay

## 2018-12-20 ENCOUNTER — Ambulatory Visit: Payer: Managed Care, Other (non HMO) | Admitting: Certified Nurse Midwife

## 2018-12-20 VITALS — BP 104/58 | HR 70 | Ht 63.0 in | Wt 130.3 lb

## 2018-12-20 DIAGNOSIS — Z0283 Encounter for blood-alcohol and blood-drug test: Secondary | ICD-10-CM

## 2018-12-20 DIAGNOSIS — Z3481 Encounter for supervision of other normal pregnancy, first trimester: Secondary | ICD-10-CM

## 2018-12-20 DIAGNOSIS — Z113 Encounter for screening for infections with a predominantly sexual mode of transmission: Secondary | ICD-10-CM

## 2018-12-20 NOTE — Progress Notes (Signed)
[facility] presents for [place] interview visit.  Pregnancy confirmation done at [facility].  G-3.  P-2.  LMP 10/14/2018.  EDD 07/25/2019. Dating scan done 12/04/2018 [facility] [redacted]w[redacted]d.  Pregnancy education material explained and given.  No cats in the home.  NOB labs ordered.  TSH/HgA1C not ordered due to increased BMI.  Body mass index is 23.08 kg/m.  Sickle cell ordered. HIV and drug screen were explained and ordered.  PNV encouraged.  Genetic screening options discussed.  Genetic testing: Unsure.  Financial policy reviewed.  FMLA form signed. Patient to [facility] in 3 weeks for NOB physical with [facility].

## 2018-12-20 NOTE — Patient Instructions (Signed)
First Trimester of Pregnancy °The first trimester of pregnancy is from week 1 until the end of week 13 (months 1 through 3). A week after a sperm fertilizes an egg, the egg will implant on the wall of the uterus. This embryo will begin to develop into a baby. Genes from you and your partner will form the baby. The female genes will determine whether the baby will be a boy or a girl. At 6-8 weeks, the eyes and face will be formed, and the heartbeat can be seen on ultrasound. At the end of 12 weeks, all the baby's organs will be formed. °Now that you are pregnant, you will want to do everything you can to have a healthy baby. Two of the most important things are to get good prenatal care and to follow your health care provider's instructions. Prenatal care is all the medical care you receive before the baby's birth. This care will help prevent, find, and treat any problems during the pregnancy and childbirth. °Body changes during your first trimester °Your body goes through many changes during pregnancy. The changes vary from woman to woman. °· You may gain or lose a couple of pounds at first. °· You may feel sick to your stomach (nauseous) and you may throw up (vomit). If the vomiting is uncontrollable, call your health care provider. °· You may tire easily. °· You may develop headaches that can be relieved by medicines. All medicines should be approved by your health care provider. °· You may urinate more often. Painful urination may mean you have a bladder infection. °· You may develop heartburn as a result of your pregnancy. °· You may develop constipation because certain hormones are causing the muscles that push stool through your intestines to slow down. °· You may develop hemorrhoids or swollen veins (varicose veins). °· Your breasts may begin to grow larger and become tender. Your nipples may stick out more, and the tissue that surrounds them (areola) may become darker. °· Your gums may bleed and may be  sensitive to brushing and flossing. °· Dark spots or blotches (chloasma, mask of pregnancy) may develop on your face. This will likely fade after the baby is born. °· Your menstrual periods will stop. °· You may have a loss of appetite. °· You may develop cravings for certain kinds of food. °· You may have changes in your emotions from day to day, such as being excited to be pregnant or being concerned that something may go wrong with the pregnancy and baby. °· You may have more vivid and strange dreams. °· You may have changes in your hair. These can include thickening of your hair, rapid growth, and changes in texture. Some women also have hair loss during or after pregnancy, or hair that feels dry or thin. Your hair will most likely return to normal after your baby is born. °What to expect at prenatal visits °During a routine prenatal visit: °· You will be weighed to make sure you and the baby are growing normally. °· Your blood pressure will be taken. °· Your abdomen will be measured to track your baby's growth. °· The fetal heartbeat will be listened to between weeks 10 and 14 of your pregnancy. °· Test results from any previous visits will be discussed. °Your health care provider may ask you: °· How you are feeling. °· If you are feeling the baby move. °· If you have had any abnormal symptoms, such as leaking fluid, bleeding, severe headaches, or abdominal   cramping. °· If you are using any tobacco products, including cigarettes, chewing tobacco, and electronic cigarettes. °· If you have any questions. °Other tests that may be performed during your first trimester include: °· Blood tests to find your blood type and to check for the presence of any previous infections. The tests will also be used to check for low iron levels (anemia) and protein on red blood cells (Rh antibodies). Depending on your risk factors, or if you previously had diabetes during pregnancy, you may have tests to check for high blood sugar  that affects pregnant women (gestational diabetes). °· Urine tests to check for infections, diabetes, or protein in the urine. °· An ultrasound to confirm the proper growth and development of the baby. °· Fetal screens for spinal cord problems (spina bifida) and Down syndrome. °· HIV (human immunodeficiency virus) testing. Routine prenatal testing includes screening for HIV, unless you choose not to have this test. °· You may need other tests to make sure you and the baby are doing well. °Follow these instructions at home: °Medicines °· Follow your health care provider's instructions regarding medicine use. Specific medicines may be either safe or unsafe to take during pregnancy. °· Take a prenatal vitamin that contains at least 600 micrograms (mcg) of folic acid. °· If you develop constipation, try taking a stool softener if your health care provider approves. °Eating and drinking ° °· Eat a balanced diet that includes fresh fruits and vegetables, whole grains, good sources of protein such as meat, eggs, or tofu, and low-fat dairy. Your health care provider will help you determine the amount of weight gain that is right for you. °· Avoid raw meat and uncooked cheese. These carry germs that can cause birth defects in the baby. °· Eating four or five small meals rather than three large meals a day may help relieve nausea and vomiting. If you start to feel nauseous, eating a few soda crackers can be helpful. Drinking liquids between meals, instead of during meals, also seems to help ease nausea and vomiting. °· Limit foods that are high in fat and processed sugars, such as fried and sweet foods. °· To prevent constipation: °? Eat foods that are high in fiber, such as fresh fruits and vegetables, whole grains, and beans. °? Drink enough fluid to keep your urine clear or pale yellow. °Activity °· Exercise only as directed by your health care provider. Most women can continue their usual exercise routine during  pregnancy. Try to exercise for 30 minutes at least 5 days a week. Exercising will help you: °? Control your weight. °? Stay in shape. °? Be prepared for labor and delivery. °· Experiencing pain or cramping in the lower abdomen or lower back is a good sign that you should stop exercising. Check with your health care provider before continuing with normal exercises. °· Try to avoid standing for long periods of time. Move your legs often if you must stand in one place for a long time. °· Avoid heavy lifting. °· Wear low-heeled shoes and practice good posture. °· You may continue to have sex unless your health care provider tells you not to. °Relieving pain and discomfort °· Wear a good support bra to relieve breast tenderness. °· Take warm sitz baths to soothe any pain or discomfort caused by hemorrhoids. Use hemorrhoid cream if your health care provider approves. °· Rest with your legs elevated if you have leg cramps or low back pain. °· If you develop varicose veins in   your legs, wear support hose. Elevate your feet for 15 minutes, 3-4 times a day. Limit salt in your diet. °Prenatal care °· Schedule your prenatal visits by the twelfth week of pregnancy. They are usually scheduled monthly at first, then more often in the last 2 months before delivery. °· Write down your questions. Take them to your prenatal visits. °· Keep all your prenatal visits as told by your health care provider. This is important. °Safety °· Wear your seat belt at all times when driving. °· Make a list of emergency phone numbers, including numbers for family, friends, the hospital, and police and fire departments. °General instructions °· Ask your health care provider for a referral to a local prenatal education class. Begin classes no later than the beginning of month 6 of your pregnancy. °· Ask for help if you have counseling or nutritional needs during pregnancy. Your health care provider can offer advice or refer you to specialists for help  with various needs. °· Do not use hot tubs, steam rooms, or saunas. °· Do not douche or use tampons or scented sanitary pads. °· Do not cross your legs for long periods of time. °· Avoid cat litter boxes and soil used by cats. These carry germs that can cause birth defects in the baby and possibly loss of the fetus by miscarriage or stillbirth. °· Avoid all smoking, herbs, alcohol, and medicines not prescribed by your health care provider. Chemicals in these products affect the formation and growth of the baby. °· Do not use any products that contain nicotine or tobacco, such as cigarettes and e-cigarettes. If you need help quitting, ask your health care provider. You may receive counseling support and other resources to help you quit. °· Schedule a dentist appointment. At home, brush your teeth with a soft toothbrush and be gentle when you floss. °Contact a health care provider if: °· You have dizziness. °· You have mild pelvic cramps, pelvic pressure, or nagging pain in the abdominal area. °· You have persistent nausea, vomiting, or diarrhea. °· You have a bad smelling vaginal discharge. °· You have pain when you urinate. °· You notice increased swelling in your face, hands, legs, or ankles. °· You are exposed to fifth disease or chickenpox. °· You are exposed to German measles (rubella) and have never had it. °Get help right away if: °· You have a fever. °· You are leaking fluid from your vagina. °· You have spotting or bleeding from your vagina. °· You have severe abdominal cramping or pain. °· You have rapid weight gain or loss. °· You vomit blood or material that looks like coffee grounds. °· You develop a severe headache. °· You have shortness of breath. °· You have any kind of trauma, such as from a fall or a car accident. °Summary °· The first trimester of pregnancy is from week 1 until the end of week 13 (months 1 through 3). °· Your body goes through many changes during pregnancy. The changes vary from  woman to woman. °· You will have routine prenatal visits. During those visits, your health care provider will examine you, discuss any test results you may have, and talk with you about how you are feeling. °This information is not intended to replace advice given to you by your health care provider. Make sure you discuss any questions you have with your health care provider. °Document Released: 05/16/2001 Document Revised: 05/04/2017 Document Reviewed: 05/03/2016 °[employer]vier Patient Education © 2020 [employer] ° °Commonly Asked   Questions During Pregnancy ° °Cats: A parasite can be excreted in cat feces.  To avoid exposure you need to have another person empty the little box.  If you must empty the litter box you will need to wear gloves.  Wash your hands after handling your cat.  This parasite can also be found in raw or undercooked meat so this should also be avoided. ° °Colds, Sore Throats, Flu: Please check your medication sheet to see what you can take for symptoms.  If your symptoms are unrelieved by these medications please call the office. ° °Dental Work: Most any dental work your dentist recommends is permitted.  X-rays should only be taken during the first trimester if absolutely necessary.  Your abdomen should be shielded with a lead apron during all x-rays.  Please notify your provider prior to receiving any x-rays.  Novocaine is fine; gas is not recommended.  If your dentist requires a note from us prior to dental work please call the office and we will provide one for you. ° °Exercise: Exercise is an important part of staying healthy during your pregnancy.  You may continue most exercises you were accustomed to prior to pregnancy.  Later in your pregnancy you will most likely notice you have difficulty with activities requiring balance like riding a bicycle.  It is important that you listen to your body and avoid activities that put you at a higher risk of falling.  Adequate rest and staying well  hydrated are a must!  If you have questions about the safety of specific activities ask your provider.   ° °Exposure to Children with illness: Try to avoid obvious exposure; report any symptoms to us when noted,  If you have chicken pos, red measles or mumps, you should be immune to these diseases.   Please do not take any vaccines while pregnant unless you have checked with your OB provider. ° °Fetal Movement: After 28 weeks we recommend you do "kick counts" twice daily.  Lie or sit down in a calm quiet environment and count your baby movements "kicks".  You should feel your baby at least 10 times per hour.  If you have not felt 10 kicks within the first hour get up, walk around and have something sweet to eat or drink then repeat for an additional hour.  If count remains less than 10 per hour notify your provider. ° °Fumigating: Follow your pest control agent's advice as to how long to stay out of your home.  Ventilate the area well before re-entering. ° °Hemorrhoids:   Most over-the-counter preparations can be used during pregnancy.  Check your medication to see what is safe to use.  It is important to use a stool softener or fiber in your diet and to drink lots of liquids.  If hemorrhoids seem to be getting worse please call the office.  ° °Hot Tubs:  Hot tubs Jacuzzis and saunas are not recommended while pregnant.  These increase your internal body temperature and should be avoided. ° °Intercourse:  Sexual intercourse is safe during pregnancy as long as you are comfortable, unless otherwise advised by your provider.  Spotting may occur after intercourse; report any bright red bleeding that is heavier than spotting. ° °Labor:  If you know that you are in labor, please go to the hospital.  If you are unsure, please call the office and let us help you decide what to do. ° °Lifting, straining, etc:  If your job requires heavy lifting or   straining please check with your provider for any limitations.  Generally, you  should not lift items heavier than that you can lift simply with your hands and arms (no back muscles) ° °Painting:  Paint fumes do not harm your pregnancy, but may make you ill and should be avoided if possible.  Latex or water based paints have less odor than oils.  Use adequate ventilation while painting. ° °Permanents & Hair Color:  Chemicals in hair dyes are not recommended as they cause increase hair dryness which can increase hair loss during pregnancy.  " Highlighting" and permanents are allowed.  Dye may be absorbed differently and permanents may not hold as well during pregnancy. ° °Sunbathing:  Use a sunscreen, as skin burns easily during pregnancy.  Drink plenty of fluids; avoid over heating. ° °Tanning Beds:  Because their possible side effects are still unknown, tanning beds are not recommended. ° °Ultrasound Scans:  Routine ultrasounds are performed at approximately 20 weeks.  You will be able to see your baby's general anatomy an if you would like to know the gender this can usually be determined as well.  If it is questionable when you conceived you may also receive an ultrasound early in your pregnancy for dating purposes.  Otherwise ultrasound exams are not routinely performed unless there is a medical necessity.  Although you can request a scan we ask that you pay for it when conducted because insurance does not cover " patient request" scans. ° °Work: If your pregnancy proceeds without complications you may work until your due date, unless your physician or employer advises otherwise. ° °Round Ligament Pain/Pelvic Discomfort:  Sharp, shooting pains not associated with bleeding are fairly common, usually occurring in the second trimester of pregnancy.  They tend to be worse when standing up or when you remain standing for long periods of time.  These are the result of pressure of certain pelvic ligaments called "round ligaments".  Rest, Tylenol and heat seem to be the most effective relief.  As  the womb and fetus grow, they rise out of the pelvis and the discomfort improves.  Please notify the office if your pain seems different than that described.  It may represent a more serious condition. ° ° °How a Baby Grows During Pregnancy ° °Pregnancy begins when a female's sperm enters a female's egg (fertilization). Fertilization usually happens in one of the tubes (fallopian tubes) that connect the ovaries to the womb (uterus). The fertilized egg moves down the fallopian tube to the uterus. Once it reaches the uterus, it implants into the lining of the uterus and begins to grow. °For the first 10 weeks, the fertilized egg is called an embryo. After 10 weeks, it is called a fetus. As the fetus continues to grow, it receives oxygen and nutrients through tissue (placenta) that grows to support the developing baby. The placenta is the life support system for the baby. It provides oxygen and nutrition and removes waste. °Learning as much as you can about your pregnancy and how your baby is developing can help you enjoy the experience. It can also make you aware of when there might be a problem and when to ask questions. °How long does a typical pregnancy last? °A pregnancy usually lasts 280 days, or about 40 weeks. Pregnancy is divided into three periods of growth, also called trimesters: °· First trimester: 0-12 weeks. °· Second trimester: 13-27 weeks. °· Third trimester: 28-40 weeks. °The day when your baby is ready to be   born (full term) is your estimated date of delivery. °How does my baby develop month by month? °First month °· The fertilized egg attaches to the inside of the uterus. °· Some cells will form the placenta. Others will form the fetus. °· The arms, legs, brain, spinal cord, lungs, and heart begin to develop. °· At the end of the first month, the heart begins to beat. °Second month °· The bones, inner ear, eyelids, hands, and feet form. °· The genitals develop. °· By the end of 8 weeks, all major  organs are developing. °Third month °· All of the internal organs are forming. °· Teeth develop below the gums. °· Bones and muscles begin to grow. The spine can flex. °· The skin is transparent. °· Fingernails and toenails begin to form. °· Arms and legs continue to grow longer, and hands and feet develop. °· The fetus is about 3 inches (7.6 cm) long. °Fourth month °· The placenta is completely formed. °· The external sex organs, neck, outer ear, eyebrows, eyelids, and fingernails are formed. °· The fetus can hear, swallow, and move its arms and legs. °· The kidneys begin to produce urine. °· The skin is covered with a white, waxy coating (vernix) and very fine hair (lanugo). °Fifth month °· The fetus moves around more and can be felt for the first time (quickening). °· The fetus starts to sleep and wake up and may begin to suck its finger. °· The nails grow to the end of the fingers. °· The organ in the digestive system that makes bile (gallbladder) functions and helps to digest nutrients. °· If your baby is a girl, eggs are present in her ovaries. If your baby is a boy, testicles start to move down into his scrotum. °Sixth month °· The lungs are formed. °· The eyes open. The brain continues to develop. °· Your baby has fingerprints and toe prints. Your baby's hair grows thicker. °· At the end of the second trimester, the fetus is about 9 inches (22.9 cm) long. °Seventh month °· The fetus kicks and stretches. °· The eyes are developed enough to sense changes in light. °· The hands can make a grasping motion. °· The fetus responds to sound. °Eighth month °· All organs and body systems are fully developed and functioning. °· Bones harden, and taste buds develop. The fetus may hiccup. °· Certain areas of the brain are still developing. The skull remains soft. °Ninth month °· The fetus gains about ½ lb (0.23 kg) each week. °· The lungs are fully developed. °· Patterns of sleep develop. °· The fetus's head typically  moves into a head-down position (vertex) in the uterus to prepare for birth. °· The fetus weighs 6-9 lb (2.72-4.08 kg) and is 19-20 inches (48.26-50.8 cm) long. °What can I do to have a healthy pregnancy and help my baby develop? °General instructions °· Take prenatal vitamins as directed by your health care provider. These include vitamins such as folic acid, iron, calcium, and vitamin D. They are important for healthy development. °· Take medicines only as directed by your health care provider. Read labels and ask a pharmacist or your health care provider whether over-the-counter medicines, supplements, and prescription drugs are safe to take during pregnancy. °· Keep all follow-up visits as directed by your health care provider. This is important. Follow-up visits include prenatal care and screening tests. °How do I know if my baby is developing well? °At each prenatal visit, your health care provider will do   several different tests to check on your health and keep track of your baby's development. These include: °· Fundal height and position. °? Your health care provider will measure your growing belly from your pubic bone to the top of the uterus using a tape measure. °? Your health care provider will also feel your belly to determine your baby's position. °· Heartbeat. °? An ultrasound in the first trimester can confirm pregnancy and show a heartbeat, depending on how far along you are. °? Your health care provider will check your baby's heart rate at every prenatal visit. °· Second trimester ultrasound. °? This ultrasound checks your baby's development. It also may show your baby's gender. °What should I do if I have concerns about my baby's development? °Always talk with your health care provider about any concerns that you may have about your pregnancy and your baby. °Summary °· A pregnancy usually lasts 280 days, or about 40 weeks. Pregnancy is divided into three periods of growth, also called  trimesters. °· Your health care provider will monitor your baby's growth and development throughout your pregnancy. °· Follow your health care provider's recommendations about taking prenatal vitamins and medicines during your pregnancy. °· Talk with your health care provider if you have any concerns about your pregnancy or your developing baby. °This information is not intended to replace advice given to you by your health care provider. Make sure you discuss any questions you have with your health care provider. °Document Released: 11/08/2007 Document Revised: 09/12/2018 Document Reviewed: 04/04/2017 °[employer] Patient Education © 2020 [employer] °  °

## 2018-12-21 LAB — VARICELLA ZOSTER ANTIBODY, IGG: Varicella zoster IgG: >4000 index (ref >165)

## 2018-12-21 LAB — HIV ANTIBODY (ROUTINE TESTING W REFLEX): HIV Screen 4th Generation wRfx: NONREACTIVE

## 2018-12-21 LAB — URINALYSIS, ROUTINE W REFLEX MICROSCOPIC
Bilirubin, UA: NEGATIVE
Glucose, UA: NEGATIVE
Ketones, UA: NEGATIVE
Leukocytes,UA: NEGATIVE
Nitrite, UA: NEGATIVE
Protein,UA: NEGATIVE
RBC, UA: NEGATIVE
Specific Gravity, UA: 1.027 (ref 1.005–1.030)
Urobilinogen, Ur: 0.2 mg/dL (ref 0.2–1.0)
pH, UA: 6.5 (ref 5.0–7.5)

## 2018-12-21 LAB — RPR: RPR Ser Ql: NONREACTIVE

## 2018-12-21 LAB — ABO AND RH: Rh Factor: POSITIVE

## 2018-12-21 LAB — SICKLE CELL SCREEN: Sickle Cell Screen: NEGATIVE

## 2018-12-21 LAB — HEPATITIS B SURFACE ANTIGEN: Hepatitis B Surface Ag: NEGATIVE

## 2018-12-21 LAB — ANTIBODY SCREEN: Antibody Screen: NEGATIVE

## 2018-12-21 LAB — RUBELLA SCREEN: Rubella Antibodies, IGG: 2.31 index (ref >0.99)

## 2018-12-22 LAB — CULTURE, OB URINE

## 2018-12-22 LAB — URINE CULTURE, OB REFLEX: Organism ID, Bacteria: NO GROWTH

## 2018-12-23 LAB — NICOTINE SCREEN, URINE: Cotinine Ql Scrn, Ur: NEGATIVE ng/mL

## 2018-12-23 LAB — MONITOR DRUG PROFILE 14(MW)
Amphetamine Scrn, Ur: NEGATIVE ng/mL
BARBITURATE SCREEN URINE: NEGATIVE ng/mL
BENZODIAZEPINE SCREEN, URINE: NEGATIVE ng/mL
Buprenorphine, Urine: NEGATIVE ng/mL
CANNABINOIDS UR QL SCN: NEGATIVE ng/mL
Cocaine (Metab) Scrn, Ur: NEGATIVE ng/mL
Creatinine(Crt), U: 231.1 mg/dL (ref 20.0–300.0)
Fentanyl, Urine: NEGATIVE pg/mL
Meperidine Screen, Urine: NEGATIVE ng/mL
Methadone Screen, Urine: NEGATIVE ng/mL
OXYCODONE+OXYMORPHONE UR QL SCN: NEGATIVE ng/mL
Opiate Scrn, Ur: NEGATIVE ng/mL
Ph of Urine: 6.6 (ref 4.5–8.9)
Phencyclidine Qn, Ur: NEGATIVE ng/mL
Propoxyphene Scrn, Ur: NEGATIVE ng/mL
SPECIFIC GRAVITY: 1.03
Tramadol Screen, Urine: NEGATIVE ng/mL

## 2018-12-27 LAB — GC/CHLAMYDIA PROBE AMP
Chlamydia trachomatis, NAA: NEGATIVE
Neisseria Gonorrhoeae by PCR: NEGATIVE

## 2019-01-10 ENCOUNTER — Encounter: Payer: Self-pay | Admitting: Certified Nurse Midwife

## 2019-01-10 ENCOUNTER — Other Ambulatory Visit: Payer: Self-pay

## 2019-01-10 ENCOUNTER — Ambulatory Visit: Payer: Managed Care, Other (non HMO) | Admitting: Certified Nurse Midwife

## 2019-01-10 VITALS — BP 97/55 | HR 68 | Wt 130.6 lb

## 2019-01-10 DIAGNOSIS — Z3480 Encounter for supervision of other normal pregnancy, unspecified trimester: Secondary | ICD-10-CM | POA: Insufficient documentation

## 2019-01-10 DIAGNOSIS — Z3481 Encounter for supervision of other normal pregnancy, first trimester: Secondary | ICD-10-CM

## 2019-01-10 DIAGNOSIS — Z3A12 12 weeks gestation of pregnancy: Secondary | ICD-10-CM | POA: Diagnosis not present

## 2019-01-10 LAB — POCT URINALYSIS DIPSTICK OB
Bilirubin, UA: NEGATIVE
Blood, UA: NEGATIVE
Glucose, UA: NEGATIVE
Ketones, UA: NEGATIVE
Leukocytes, UA: NEGATIVE
Nitrite, UA: NEGATIVE
POC,PROTEIN,UA: NEGATIVE
Spec Grav, UA: 1.02 (ref 1.010–1.025)
Urobilinogen, UA: 0.2 E.U./dL
pH, UA: 5 (ref 5.0–8.0)

## 2019-01-10 NOTE — Progress Notes (Signed)
NEW OB HISTORY AND PHYSICAL  SUBJECTIVE:       Kristine King is a 28 y.o. [id number] female, Patient's last menstrual period was 10/14/2018 (exact date)., Estimated Date of Delivery: 07/25/19, [id number], presents today for establishment of Prenatal Care. She has no unusual complaints and complains of nausea.       Gynecologic History Patient's last menstrual period was 10/14/2018 (exact date). Normal Contraception: none Last Pap: 07/28/16. Results were: normal  Obstetric History OB History  Gravida Para Term Preterm AB Living  3 2 2     2   SAB TAB Ectopic Multiple Live Births          2    # Outcome Date GA Lbr Len/2nd Weight Sex Delivery Anes PTL Lv  3 Current           2 Term 02/02/17   8 lb 8 oz (3.856 kg) M Vag-Spont  N LIV  1 Term 12/16/09   6 lb 10 oz (3.005 kg) F Vag-Spont  N LIV    Past Medical History:  Diagnosis Date  . Allergy   . Anemia     Past Surgical History:  Procedure Laterality Date  . none      Current Outpatient Medications on File Prior to Visit  Medication Sig Dispense Refill  . Prenatal Vit-Fe Fumarate-FA (PRENATAL MULTIVITAMIN) TABS tablet Take 1 tablet by mouth daily at 12 noon.     No current facility-administered medications on file prior to visit.     Allergies  Allergen Reactions  . Meloxicam Swelling  . Penicillin G Hives    Social History   Socioeconomic History  . Marital status: Married    Spouse name: Regan RakersRoberto  . Number of children: 1  . Years of education: Not on file  . Highest education level: Not on file  Occupational History  . Occupation: patient billiing  Social Needs  . Financial resource strain: Not on file  . Food insecurity    Worry: Not on file    Inability: Not on file  . Transportation needs    Medical: Not on file    Non-medical: Not on file  Tobacco Use  . Smoking status: Never Smoker  . Smokeless tobacco: Never Used  Substance and Sexual Activity  . Alcohol use: No  . Drug use: No  . Sexual  activity: Yes    Partners: Male    Birth control/protection: None  Lifestyle  . Physical activity    Days per week: Not on file    Minutes per session: Not on file  . Stress: Not on file  Relationships  . Social Musicianconnections    Talks on phone: Not on file    Gets together: Not on file    Attends religious service: Not on file    Active member of club or organization: Not on file    Attends meetings of clubs or organizations: Not on file    Relationship status: Not on file  . Intimate partner violence    Fear of current or ex partner: Not on file    Emotionally abused: Not on file    Physically abused: Not on file    Forced sexual activity: Not on file  Other Topics Concern  . Not on file  Social History Narrative  . Not on file    Family History  Problem Relation Age of Onset  . Diabetes Paternal Grandfather   . Breast cancer Neg Hx   . Ovarian cancer Neg Hx   .  Colon cancer Neg Hx     The following portions of the patient's history were reviewed and updated as appropriate: allergies, current medications, past OB history, past medical history, past surgical history, past family history, past social history, and problem list.    OBJECTIVE: Initial Physical Exam (New OB)  GENERAL APPEARANCE: alert, well appearing, in no apparent distress, oriented to person, place and time HEAD: normocephalic, atraumatic MOUTH: mucous membranes moist, pharynx normal without lesions THYROID: no thyromegaly or masses present BREASTS: no masses noted, no significant tenderness, no palpable axillary nodes, no skin changes LUNGS: clear to auscultation, no wheezes, rales or rhonchi, symmetric air entry HEART: regular rate and rhythm, no murmurs ABDOMEN: soft, nontender, nondistended, no abnormal masses, no epigastric pain, fundus soft, nontender 12 weeks size and FHT present EXTREMITIES: no redness or tenderness in the calves or thighs, no edema, no limitation in range of motion, intact  peripheral pulses SKIN: normal coloration and turgor, no rashes LYMPH NODES: no adenopathy palpable NEUROLOGIC: alert, oriented, normal speech, no focal findings or movement disorder noted  PELVIC EXAM EXTERNAL GENITALIA: normal appearing vulva with no masses, tenderness or lesions VAGINA: no abnormal discharge or lesions CERVIX: no lesions or cervical motion tenderness UTERUS: gravid ADNEXA: no masses palpable and nontender OB EXAM PELVIMETRY: appears adequate RECTUM: exam not indicated  ASSESSMENT: Normal pregnancy  PLAN: Prenatal care See ordersNew OB counseling: The patient has been given an overview regarding routine prenatal care. Recommendations regarding diet, weight gain, and exercise in pregnancy were given. Prenatal testing, optional genetic testing,carreir screening,  and ultrasound use in pregnancy were reviewed.  Maternit 21 collected today.  Benefits of Breast Feeding were discussed. The patient is encouraged to consider nursing her baby post partum. Thinks she will pump. Follow up 4 wks.   Philip Aspen, CNM

## 2019-01-10 NOTE — Patient Instructions (Signed)

## 2019-01-10 NOTE — Addendum Note (Signed)
Addended by: Raliegh Ip on: 01/10/2019 11:03 AM   Modules accepted: Orders

## 2019-01-16 LAB — MATERNIT 21 PLUS CORE, BLOOD
Fetal Fraction: 10
Result (T21): NEGATIVE
Trisomy 13 (Patau syndrome): NEGATIVE
Trisomy 18 (Edwards syndrome): NEGATIVE
Trisomy 21 (Down syndrome): NEGATIVE

## 2019-02-07 ENCOUNTER — Ambulatory Visit: Payer: Managed Care, Other (non HMO) | Admitting: Obstetrics and Gynecology

## 2019-02-07 ENCOUNTER — Other Ambulatory Visit: Payer: Self-pay

## 2019-02-07 VITALS — BP 100/79 | HR 71 | Wt 135.1 lb

## 2019-02-07 DIAGNOSIS — Z23 Encounter for immunization: Secondary | ICD-10-CM | POA: Diagnosis not present

## 2019-02-07 DIAGNOSIS — Z3492 Encounter for supervision of normal pregnancy, unspecified, second trimester: Secondary | ICD-10-CM

## 2019-02-07 LAB — POCT URINALYSIS DIPSTICK OB
Bilirubin, UA: NEGATIVE
Blood, UA: NEGATIVE
Glucose, UA: NEGATIVE
Ketones, UA: NEGATIVE
Leukocytes, UA: NEGATIVE
Nitrite, UA: NEGATIVE
POC,PROTEIN,UA: NEGATIVE
Spec Grav, UA: 1.015 (ref 1.010–1.025)
Urobilinogen, UA: 0.2 E.U./dL
pH, UA: 6 (ref 5.0–8.0)

## 2019-02-07 NOTE — Progress Notes (Signed)
ROB- doing well, flu vaccine given. Anatomy scan next visit.

## 2019-02-07 NOTE — Progress Notes (Signed)
ROB- pt is doing well, flu given 

## 2019-02-25 ENCOUNTER — Encounter: Payer: Self-pay | Admitting: Certified Nurse Midwife

## 2019-03-06 ENCOUNTER — Ambulatory Visit (INDEPENDENT_AMBULATORY_CARE_PROVIDER_SITE_OTHER): Payer: Managed Care, Other (non HMO)

## 2019-03-06 ENCOUNTER — Other Ambulatory Visit: Payer: Self-pay

## 2019-03-06 ENCOUNTER — Ambulatory Visit: Payer: Managed Care, Other (non HMO) | Admitting: Certified Nurse Midwife

## 2019-03-06 VITALS — BP 100/80 | HR 84 | Wt 138.0 lb

## 2019-03-06 DIAGNOSIS — Z3492 Encounter for supervision of normal pregnancy, unspecified, second trimester: Secondary | ICD-10-CM

## 2019-03-06 DIAGNOSIS — O26892 Other specified pregnancy related conditions, second trimester: Secondary | ICD-10-CM

## 2019-03-06 DIAGNOSIS — O4440 Low lying placenta NOS or without hemorrhage, unspecified trimester: Secondary | ICD-10-CM | POA: Insufficient documentation

## 2019-03-06 DIAGNOSIS — O4442 Low lying placenta NOS or without hemorrhage, second trimester: Secondary | ICD-10-CM

## 2019-03-06 DIAGNOSIS — Z3A19 19 weeks gestation of pregnancy: Secondary | ICD-10-CM

## 2019-03-06 DIAGNOSIS — O26899 Other specified pregnancy related conditions, unspecified trimester: Secondary | ICD-10-CM | POA: Insufficient documentation

## 2019-03-06 DIAGNOSIS — G5600 Carpal tunnel syndrome, unspecified upper limb: Secondary | ICD-10-CM | POA: Insufficient documentation

## 2019-03-06 LAB — POCT URINALYSIS DIPSTICK OB
Bilirubin, UA: NEGATIVE
Blood, UA: NEGATIVE
Glucose, UA: NEGATIVE
Ketones, UA: NEGATIVE
Nitrite, UA: NEGATIVE
POC,PROTEIN,UA: NEGATIVE
Spec Grav, UA: <=1.005 — AB (ref 1.010–1.025)
Urobilinogen, UA: 0.2 E.U./dL
pH, UA: 7 (ref 5.0–8.0)

## 2019-03-06 NOTE — Progress Notes (Signed)
ROB-Pt present for routine prenatal care. Pt stated having numbing in her hands at night when she sleep. No other problems.

## 2019-03-06 NOTE — Patient Instructions (Signed)
Vaginal Bleeding During Pregnancy, Second Trimester  A small amount of bleeding (spotting) from the vagina is common during pregnancy. Sometimes the bleeding is normal and is not a sign of problems. In some other cases, it is a sign of something serious. Tell your doctor right away if there is any bleeding from your vagina. Follow these instructions at home: Activity  Follow your doctor's instructions about how active you can be.  If needed, make plans for someone to help with your normal activities.  Do not exercise or do activities that take a lot of effort until your doctor says that this is safe.  Do not lift anything that is heavier than 10 lb (4.5 kg) until your doctor says that this is safe.  Do not have sex or orgasms until your doctor says that this is safe. Medicines  Take over-the-counter and prescription medicines only as told by your doctor.  Do not take aspirin. It can cause bleeding. General instructions  Watch your condition for any changes.  Write down: ? The number of pads you use each day. ? How often you change pads. ? How soaked your pads are.  Do not use tampons.  Do not douche.  If you pass any tissue from your vagina, save it to show to your doctor.  Keep all follow-up visits as told by your doctor. This is important. Contact a doctor if:  You have bleeding in the vagina at any time during pregnancy.  You have cramps.  You have a fever that does not get better with medicine. Get help right away if:  You have very bad cramps in your back or belly (abdomen).  You have contractions.  You have chills.  You pass large clots or a lot of tissue from your vagina.  Your bleeding gets worse.  You feel light-headed.  You feel weak.  You pass out (faint).  You are leaking fluid from your vagina.  You have a gush of fluid from your vagina. Summary  Sometimes vaginal bleeding during pregnancy is normal and is not a problem. Sometimes it may  be a sign of something serious.  Tell your doctor about any bleeding from your vagina right away.  Follow your doctor's instructions about how active you can be. You may need someone to help you with your normal activities. This information is not intended to replace advice given to you by your health care provider. Make sure you discuss any questions you have with your health care provider. Document Released: 10/06/2013 Document Revised: 09/10/2018 Document Reviewed: 08/23/2016 Elsevier Patient Education  2020 [employer] Back Pain in Pregnancy Back pain during pregnancy is common. Back pain may be caused by several factors that are related to changes during your pregnancy. Follow these instructions at home: Managing pain, stiffness, and swelling      If directed, for sudden (acute) back pain, put ice on the painful area. ? Put ice in a plastic bag. ? Place a towel between your skin and the bag. ? Leave the ice on for 20 minutes, 2-3 times per day.  If directed, apply heat to the affected area before you exercise. Use the heat source that your health care provider recommends, such as a moist heat pack or a heating pad. ? Place a towel between your skin and the heat source. ? Leave the heat on for 20-30 minutes. ? Remove the heat if your skin turns bright red. This is especially important if you are unable to feel pain,  heat, or cold. You may have a greater risk of getting burned.  If directed, massage the affected area. Activity  Exercise as told by your health care provider. Gentle exercise is the best [place] to prevent or manage back pain.  Listen to your body when lifting. If lifting hurts, ask for help or bend your knees. This uses your leg muscles instead of your back muscles.  Squat down when picking up something from the floor. Do not bend over.  Only use bed rest for short periods as told by your health care provider. Bed rest should only be used for the most severe  episodes of back pain. Standing, sitting, and lying down  Do not stand in one place for long periods of time.  Use good posture when sitting. Make sure your head rests over your shoulders and is not hanging forward. Use a pillow on your lower back if necessary.  Try sleeping on your side, preferably the left side, with a pregnancy support pillow or 1-2 regular pillows between your legs. ? If you have back pain after a night's rest, your bed may be too soft. ? A firm mattress may provide more support for your back during pregnancy. General instructions  Do not wear high heels.  Eat a healthy diet. Try to gain weight within your health care provider's recommendations.  Use a maternity girdle, elastic sling, or back brace as told by your health care provider.  Take over-the-counter and prescription medicines only as told by your health care provider.  Work with a physical therapist or massage therapist to find ways to manage back pain. Acupuncture or massage therapy may be helpful.  Keep all follow-up visits as told by your health care provider. This is important. Contact a health care provider if:  Your back pain interferes with your daily activities.  You have increasing pain in other parts of your body. Get help right away if:  You develop numbness, tingling, weakness, or problems with the use of your arms or legs.  You develop severe back pain that is not controlled with medicine.  You have a change in bowel or bladder control.  You develop shortness of breath, dizziness, or you faint.  You develop nausea, vomiting, or sweating.  You have back pain that is a rhythmic, cramping pain similar to labor pains. Labor pain is usually 1-2 minutes apart, lasts for about 1 minute, and involves a bearing down feeling or pressure in your pelvis.  You have back pain and your water breaks or you have vaginal bleeding.  You have back pain or numbness that travels down your leg.  Your  back pain developed after you fell.  You develop pain on one side of your back.  You see blood in your urine.  You develop skin blisters in the area of your back pain. Summary  Back pain may be caused by several factors that are related to changes during your pregnancy.  Follow instructions as told by your health care provider for managing pain, stiffness, and swelling.  Exercise as told by your health care provider. Gentle exercise is the best [place] to prevent or manage back pain.  Take over-the-counter and prescription medicines only as told by your health care provider.  Keep all follow-up visits as told by your health care provider. This is important. This information is not intended to replace advice given to you by your health care provider. Make sure you discuss any questions you have with your health care  provider. Document Released: 08/30/2005 Document Revised: 09/10/2018 Document Reviewed: 11/07/2017 Elsevier Patient Education  [place] of Pregnancy  The second trimester is from week 14 through week 27 (month 4 through 6). This is often the time in pregnancy that you feel your best. Often times, morning sickness has lessened or quit. You may have more energy, and you may get hungry more often. Your unborn baby is growing rapidly. At the end of the sixth month, he or she is about 9 inches long and weighs about 1 pounds. You will likely feel the baby move between 18 and 20 weeks of pregnancy. Follow these instructions at home: Medicines  Take over-the-counter and prescription medicines only as told by your doctor. Some medicines are safe and some medicines are not safe during pregnancy.  Take a prenatal vitamin that contains at least 600 micrograms (mcg) of folic acid.  If you have trouble pooping (constipation), take medicine that will make your stool soft (stool softener) if your doctor approves. Eating and drinking   Eat regular, healthy meals.   Avoid raw meat and uncooked cheese.  If you get low calcium from the food you eat, talk to your doctor about taking a daily calcium supplement.  Avoid foods that are high in fat and sugars, such as fried and sweet foods.  If you feel sick to your stomach (nauseous) or throw up (vomit): ? Eat 4 or 5 small meals a day instead of 3 large meals. ? Try eating a few soda crackers. ? Drink liquids between meals instead of during meals.  To prevent constipation: ? Eat foods that are high in fiber, like fresh fruits and vegetables, whole grains, and beans. ? Drink enough fluids to keep your pee (urine) clear or pale yellow. Activity  Exercise only as told by your doctor. Stop exercising if you start to have cramps.  Do not exercise if it is too hot, too humid, or if you are in a place of great height (high altitude).  Avoid heavy lifting.  Wear low-heeled shoes. Sit and stand up straight.  You can continue to have sex unless your doctor tells you not to. Relieving pain and discomfort  Wear a good support bra if your breasts are tender.  Take warm water baths (sitz baths) to soothe pain or discomfort caused by hemorrhoids. Use hemorrhoid cream if your doctor approves.  Rest with your legs raised if you have leg cramps or low back pain.  If you develop puffy, bulging veins (varicose veins) in your legs: ? Wear support hose or compression stockings as told by your doctor. ? Raise (elevate) your feet for 15 minutes, 3-4 times a day. ? Limit salt in your food. Prenatal care  Write down your questions. Take them to your prenatal visits.  Keep all your prenatal visits as told by your doctor. This is important. Safety  Wear your seat belt when driving.  Make a list of emergency phone numbers, including numbers for family, friends, the hospital, and police and fire departments. General instructions  Ask your doctor about the right foods to eat or for help finding a counselor, if you  need these services.  Ask your doctor about local prenatal classes. Begin classes before month 6 of your pregnancy.  Do not use hot tubs, steam rooms, or saunas.  Do not douche or use tampons or scented sanitary pads.  Do not cross your legs for long periods of time.  Visit your dentist if you have  not done so. Use a soft toothbrush to brush your teeth. Floss gently.  Avoid all smoking, herbs, and alcohol. Avoid drugs that are not approved by your doctor.  Do not use any products that contain nicotine or tobacco, such as cigarettes and e-cigarettes. If you need help quitting, ask your doctor.  Avoid cat litter boxes and soil used by cats. These carry germs that can cause birth defects in the baby and can cause a loss of your baby (miscarriage) or stillbirth. Contact a doctor if:  You have mild cramps or pressure in your lower belly.  You have pain when you pee (urinate).  You have bad smelling fluid coming from your vagina.  You continue to feel sick to your stomach (nauseous), throw up (vomit), or have watery poop (diarrhea).  You have a nagging pain in your belly area.  You feel dizzy. Get help right away if:  You have a fever.  You are leaking fluid from your vagina.  You have spotting or bleeding from your vagina.  You have severe belly cramping or pain.  You lose or gain weight rapidly.  You have trouble catching your breath and have chest pain.  You notice sudden or extreme puffiness (swelling) of your face, hands, ankles, feet, or legs.  You have not felt the baby move in over an hour.  You have severe headaches that do not go away when you take medicine.  You have trouble seeing. Summary  The second trimester is from week 14 through week 27 (months 4 through 6). This is often the time in pregnancy that you feel your best.  To take care of yourself and your unborn baby, you will need to eat healthy meals, take medicines only if your doctor tells you to do  so, and do activities that are safe for you and your baby.  Call your doctor if you get sick or if you notice anything unusual about your pregnancy. Also, call your doctor if you need help with the right food to eat, or if you want to know what activities are safe for you. This information is not intended to replace advice given to you by your health care provider. Make sure you discuss any questions you have with your health care provider. Document Released: 08/16/2009 Document Revised: 09/13/2018 Document Reviewed: 06/27/2016 Elsevier Patient Education  2020 [employer].

## 2019-03-06 NOTE — Progress Notes (Signed)
ROB-Reports bilateral hand numbness occurring nightly. Discussed home treatment measures for carpal tunnel in pregnancy; handout provided. Anatomy scan today complete and normal except LLP; findings reviewed with patient, verbalized understanding. Aware of repeat ultrasound at 28 weeks. Anticipatory guidance regarding course of prenatal care. Reviewed red flag symptoms and when to call. RTC x 4 weeks for ROB or sooner if needed.   ULTRASOUND REPORT  Location: [facility] Date of Service: 03/06/2019   Indications:Anatomy Ultrasound Findings:  Singleton intrauterine pregnancy is visualized with FHR at 153 BPM. Biometrics give an (U/S) Gestational age of [redacted]w[redacted]d and an (U/S) EDD of 07/22/2019; this correlates with the clinically established Estimated Date of Delivery: 07/25/19  Fetal presentation is Variable.  EFW: 374 g ( 13 oz). Placenta: low lying. Grade: 1 AFI: subjectively normal.  Anatomic survey is complete and normal; Gender - surprise.    Right Ovary is normal in appearance. Left Ovary is normal appearance. Survey of the adnexa demonstrates no adnexal masses. There is no free peritoneal fluid in the cul de sac.  Impression: 1. [redacted]w[redacted]d Viable Singleton Intrauterine pregnancy by U/S. 2. (U/S) EDD is consistent with Clinically established Estimated Date of Delivery: 07/25/19 . 3. Normal Anatomy Scan  Recommendations: 1.Clinical correlation with the patient's History and Physical Exam.

## 2019-04-03 ENCOUNTER — Other Ambulatory Visit: Payer: Self-pay

## 2019-04-03 ENCOUNTER — Ambulatory Visit: Payer: Managed Care, Other (non HMO) | Admitting: Obstetrics and Gynecology

## 2019-04-03 VITALS — BP 89/53 | HR 76 | Wt 144.0 lb

## 2019-04-03 DIAGNOSIS — O4440 Low lying placenta NOS or without hemorrhage, unspecified trimester: Secondary | ICD-10-CM

## 2019-04-03 DIAGNOSIS — Z3A24 24 weeks gestation of pregnancy: Secondary | ICD-10-CM

## 2019-04-03 DIAGNOSIS — Z3482 Encounter for supervision of other normal pregnancy, second trimester: Secondary | ICD-10-CM

## 2019-04-03 DIAGNOSIS — O4442 Low lying placenta NOS or without hemorrhage, second trimester: Secondary | ICD-10-CM

## 2019-04-03 LAB — POCT URINALYSIS DIPSTICK OB
Bilirubin, UA: NEGATIVE
Blood, UA: NEGATIVE
Glucose, UA: NEGATIVE
Ketones, UA: NEGATIVE
Nitrite, UA: NEGATIVE
POC,PROTEIN,UA: NEGATIVE
Spec Grav, UA: 1.015 (ref 1.010–1.025)
Urobilinogen, UA: 0.2 E.U./dL
pH, UA: 6.5 (ref 5.0–8.0)

## 2019-04-03 NOTE — Progress Notes (Signed)
ROB-Pt present for routine prenatal care. Pt stated that she was doing well just concerned about her blood pressure being low.

## 2019-04-03 NOTE — Progress Notes (Signed)
ROB- discussed blood pressure changes in pregnancy and reassured, glucose  And follow up ultrasound for LLP at next visit.

## 2019-05-06 ENCOUNTER — Other Ambulatory Visit: Payer: Self-pay

## 2019-05-06 ENCOUNTER — Other Ambulatory Visit: Payer: Managed Care, Other (non HMO)

## 2019-05-06 ENCOUNTER — Encounter: Payer: Self-pay | Admitting: Certified Nurse Midwife

## 2019-05-06 ENCOUNTER — Ambulatory Visit (INDEPENDENT_AMBULATORY_CARE_PROVIDER_SITE_OTHER): Payer: Managed Care, Other (non HMO)

## 2019-05-06 ENCOUNTER — Ambulatory Visit (INDEPENDENT_AMBULATORY_CARE_PROVIDER_SITE_OTHER): Payer: Managed Care, Other (non HMO) | Admitting: Certified Nurse Midwife

## 2019-05-06 VITALS — BP 92/55 | HR 77 | Wt 148.5 lb

## 2019-05-06 DIAGNOSIS — Z131 Encounter for screening for diabetes mellitus: Secondary | ICD-10-CM

## 2019-05-06 DIAGNOSIS — Z3482 Encounter for supervision of other normal pregnancy, second trimester: Secondary | ICD-10-CM

## 2019-05-06 DIAGNOSIS — Z23 Encounter for immunization: Secondary | ICD-10-CM | POA: Diagnosis not present

## 2019-05-06 DIAGNOSIS — Z3483 Encounter for supervision of other normal pregnancy, third trimester: Secondary | ICD-10-CM

## 2019-05-06 DIAGNOSIS — Z3A28 28 weeks gestation of pregnancy: Secondary | ICD-10-CM

## 2019-05-06 DIAGNOSIS — Z130 Encounter for screening for diseases of the blood and blood-forming organs and certain disorders involving the immune mechanism: Secondary | ICD-10-CM

## 2019-05-06 DIAGNOSIS — O4440 Low lying placenta NOS or without hemorrhage, unspecified trimester: Secondary | ICD-10-CM | POA: Diagnosis not present

## 2019-05-06 DIAGNOSIS — Z3493 Encounter for supervision of normal pregnancy, unspecified, third trimester: Secondary | ICD-10-CM

## 2019-05-06 DIAGNOSIS — Z113 Encounter for screening for infections with a predominantly sexual mode of transmission: Secondary | ICD-10-CM

## 2019-05-06 LAB — POCT URINALYSIS DIPSTICK OB
Bilirubin, UA: NEGATIVE
Blood, UA: NEGATIVE
Glucose, UA: NEGATIVE
Ketones, UA: NEGATIVE
Leukocytes, UA: NEGATIVE
Nitrite, UA: NEGATIVE
POC,PROTEIN,UA: NEGATIVE
Spec Grav, UA: 1.015 (ref 1.010–1.025)
Urobilinogen, UA: 0.2 E.U./dL
pH, UA: 5 (ref 5.0–8.0)

## 2019-05-06 MED ORDER — TETANUS-DIPHTH-ACELL PERTUSSIS 5-2.5-18.5 LF-MCG/0.5 IM SUSP
0.5000 mL | Freq: Once | INTRAMUSCULAR | Status: AC
  Administered 2019-05-06: 0.5 mL via INTRAMUSCULAR

## 2019-05-06 NOTE — Progress Notes (Signed)
ROB doing well. Feels good movement. TDAP/Glucose screen/RPR/CBC/BTC today. Discussed BC after baby (pamphlet given) she is undecided at this point. Sample birth plan given . Pt instructed to reviewed. Will review pt request at next appointment. U/s today for LLP- resolved  Follow up 2-3 wks .   Patient Name: Kristine King DOB: Oct 06, 1990 MRN: [id number] ULTRASOUND REPORT  Location: [facility] Date of Service: 05/06/2019   Indications: Anatomy follow up ultrasound/ Placenta Findings:  Kristine King intrauterine pregnancy is visualized with FHR at 128 BPM.  Fetal presentation is Cephalic.  Placenta: posterior. Grade: 1 AFI: subjectively normal.  Anatomic survey is complete.   There is no free peritoneal fluid in the cul de sac.  Impression: 1. [redacted]w[redacted]d Viable Singleton Intrauterine pregnancy previously established criteria. 2. Normal Anatomy Scan is now complete  3. Placenta edge has moved away from internal OS.  Recommendations: 1.Clinical correlation with the patient's History and Physical Exam.   Jenine M. Champlin, CNM

## 2019-05-06 NOTE — Patient Instructions (Signed)
Glucose Tolerance Test During Pregnancy Why am I having this test? The glucose tolerance test (GTT) is done to check how your body processes sugar (glucose). This is one of several tests used to diagnose diabetes that develops during pregnancy (gestational diabetes mellitus). Gestational diabetes is a temporary form of diabetes that some women develop during pregnancy. It usually occurs during the second trimester of pregnancy and goes away after delivery. Testing (screening) for gestational diabetes usually occurs between 24 and 28 weeks of pregnancy. You may have the GTT test after having a 1-hour glucose screening test if the results from that test indicate that you may have gestational diabetes. You may also have this test if:  You have a history of gestational diabetes.  You have a history of giving birth to very large babies or have experienced repeated fetal loss (stillbirth).  You have signs and symptoms of diabetes, such as: ? Changes in your vision. ? Tingling or numbness in your hands or feet. ? Changes in hunger, thirst, and urination that are not otherwise explained by your pregnancy. What is being tested? This test measures the amount of glucose in your blood at different times during a period of 3 hours. This indicates how well your body is able to process glucose. What kind of sample is taken?  Blood samples are required for this test. They are usually collected by inserting a needle into a blood vessel. How do I prepare for this test?  For 3 days before your test, eat normally. Have plenty of carbohydrate-rich foods.  Follow instructions from your health care provider about: ? Eating or drinking restrictions on the day of the test. You may be asked to not eat or drink anything other than water (fast) starting 8-10 hours before the test. ? Changing or stopping your regular medicines. Some medicines may interfere with this test. Tell a health care provider about:  All  medicines you are taking, including vitamins, herbs, eye drops, creams, and over-the-counter medicines.  Any blood disorders you have.  Any surgeries you have had.  Any medical conditions you have. What happens during the test? First, your blood glucose will be measured. This is referred to as your fasting blood glucose, since you fasted before the test. Then, you will drink a glucose solution that contains a certain amount of glucose. Your blood glucose will be measured again 1, 2, and 3 hours after drinking the solution. This test takes about 3 hours to complete. You will need to stay at the testing location during this time. During the testing period:  Do not eat or drink anything other than the glucose solution.  Do not exercise.  Do not use any products that contain nicotine or tobacco, such as cigarettes and e-cigarettes. If you need help stopping, ask your health care provider. The testing procedure may vary among health care providers and hospitals. How are the results reported? Your results will be reported as milligrams of glucose per deciliter of blood (mg/dL) or millimoles per liter (mmol/L). Your health care provider will compare your results to normal ranges that were established after testing a large group of people (reference ranges). Reference ranges may vary among labs and hospitals. For this test, common reference ranges are:  Fasting: less than 95-105 mg/dL (5.3-5.8 mmol/L).  1 hour after drinking glucose: less than 180-190 mg/dL (10.0-10.5 mmol/L).  2 hours after drinking glucose: less than 155-165 mg/dL (8.6-9.2 mmol/L).  3 hours after drinking glucose: 140-145 mg/dL (7.8-8.1 mmol/L). What do the   results mean? Results within reference ranges are considered normal, meaning that your glucose levels are well-controlled. If two or more of your blood glucose levels are high, you may be diagnosed with gestational diabetes. If only one level is high, your health care  provider may suggest repeat testing or other tests to confirm a diagnosis. Talk with your health care provider about what your results mean. Questions to ask your health care provider Ask your health care provider, or the department that is doing the test:  When will my results be ready?  How will I get my results?  What are my treatment options?  What other tests do I need?  What are my next steps? Summary  The glucose tolerance test (GTT) is one of several tests used to diagnose diabetes that develops during pregnancy (gestational diabetes mellitus). Gestational diabetes is a temporary form of diabetes that some women develop during pregnancy.  You may have the GTT test after having a 1-hour glucose screening test if the results from that test indicate that you may have gestational diabetes. You may also have this test if you have any symptoms or risk factors for gestational diabetes.  Talk with your health care provider about what your results mean. This information is not intended to replace advice given to you by your health care provider. Make sure you discuss any questions you have with your health care provider. Document Released: 11/21/2011 Document Revised: 09/12/2018 Document Reviewed: 01/01/2017 Elsevier Patient Education  2020 Elsevier Inc.  

## 2019-05-07 ENCOUNTER — Other Ambulatory Visit: Payer: Self-pay | Admitting: Certified Nurse Midwife

## 2019-05-07 LAB — CBC
Hematocrit: 29.9 % — ABNORMAL LOW (ref 34.0–46.6)
Hemoglobin: 9.3 g/dL — ABNORMAL LOW (ref 11.1–15.9)
MCH: 24.1 pg — ABNORMAL LOW (ref 26.6–33.0)
MCHC: 31.1 g/dL — ABNORMAL LOW (ref 31.5–35.7)
MCV: 78 fL — ABNORMAL LOW (ref 79–97)
Platelets: 219 10*3/uL (ref 150–450)
RBC: 3.86 x10E6/uL (ref 3.77–5.28)
RDW: 13.8 % (ref 11.7–15.4)
WBC: 6.2 10*3/uL (ref 3.4–10.8)

## 2019-05-07 LAB — RPR: RPR Ser Ql: NONREACTIVE

## 2019-05-07 LAB — GLUCOSE, 1 HOUR GESTATIONAL: Gestational Diabetes Screen: 101 mg/dL (ref 65–139)

## 2019-05-07 MED ORDER — FUSION PLUS PO CAPS: 1.0000 | ORAL_CAPSULE | Freq: Every day | ORAL | 4 refills | Status: AC

## 2019-05-07 NOTE — Progress Notes (Signed)
Pt anemic order placed for fusion plus.   Philip Aspen, CNM

## 2019-05-28 ENCOUNTER — Other Ambulatory Visit: Payer: Self-pay

## 2019-05-28 ENCOUNTER — Ambulatory Visit (INDEPENDENT_AMBULATORY_CARE_PROVIDER_SITE_OTHER): Payer: Managed Care, Other (non HMO) | Admitting: Certified Nurse Midwife

## 2019-05-28 VITALS — BP 103/62 | HR 75 | Wt 153.6 lb

## 2019-05-28 DIAGNOSIS — Z3483 Encounter for supervision of other normal pregnancy, third trimester: Secondary | ICD-10-CM

## 2019-05-28 NOTE — Progress Notes (Signed)
ROB doing well. Feels good movement. States she had some cramping yesterday through out day. Discussed braxton hicks vs PTL. Precautions reviewed. Follow up 2 wks.   Philip Aspen, CNM

## 2019-05-28 NOTE — Patient Instructions (Signed)

## 2019-06-06 NOTE — L&D Delivery Note (Signed)
Delivery Summary for [facility]  Labor Events:   Preterm labor: No data found  Rupture date: 07/15/2019  Rupture time: 12:54 AM  Rupture type: Spontaneous  Fluid Color: Clear  Induction: No data found  Augmentation: No data found  Complications: No data found  Cervical ripening: No data found No data found   No data found     Delivery:   Episiotomy: No data found  Lacerations: No data found  Repair suture: No data found  Repair # of packets: No data found  Blood loss (ml): 105   Information for the patient's newborn:  Vandella, Ord [[id number]]   Delivery 07/15/2019 1:59 AM by  Vaginal, Spontaneous Sex:  female Gestational Age: [redacted]w[redacted]d Delivery Clinician:   Living?:         APGARS  One minute Five minutes Ten minutes  Skin color:        Heart rate:        Grimace:        Muscle tone:        Breathing:        Totals: 8  9      Presentation/position:      Resuscitation:   Cord information:    Disposition of cord blood:     Blood gases sent?  Complications:   Placenta: Delivered:       appearance Newborn Measurements: Weight: 8 lb 1.8 oz (3680 g)  Height: 20.87"  Head circumference:    Chest circumference:    Other providers:    Additional  information: Forceps:   Vacuum:   Breech:   Observed anomalies     Delivery Note At 1:59 AM a viable and healthy female was delivered via Vaginal, Spontaneous (Presentation:  Vertex, Direct Occiput Posterior).  APGAR: 8, 9; weight 8 lb 1.8 oz (3680 g).   Placenta status: Spontaneous, Intact.  Cord: 3 vessels with the following complications: None.  Cord pH: not obtained.  Delayed cord blood clamping observed.    Anesthesia: None Episiotomy: None Lacerations: None Suture Repair:  None Est. Blood Loss (mL): 105  Mom to postpartum.  Baby to Couplet care / Skin to Skin.  Hildred Laser, MD 07/15/2019,  2:32 AM

## 2019-06-13 ENCOUNTER — Encounter: Payer: Managed Care, Other (non HMO) | Admitting: Certified Nurse Midwife

## 2019-06-13 ENCOUNTER — Ambulatory Visit: Payer: Medicaid Other | Attending: Internal Medicine

## 2019-06-13 DIAGNOSIS — Z20822 Contact with and (suspected) exposure to covid-19: Secondary | ICD-10-CM

## 2019-06-15 LAB — NOVEL CORONAVIRUS, NAA: SARS-CoV-2, NAA: NOT DETECTED

## 2019-07-04 ENCOUNTER — Ambulatory Visit (INDEPENDENT_AMBULATORY_CARE_PROVIDER_SITE_OTHER): Payer: Managed Care, Other (non HMO) | Admitting: Certified Nurse Midwife

## 2019-07-04 ENCOUNTER — Encounter: Payer: Self-pay | Admitting: Certified Nurse Midwife

## 2019-07-04 ENCOUNTER — Ambulatory Visit: Payer: Managed Care, Other (non HMO) | Attending: Internal Medicine

## 2019-07-04 ENCOUNTER — Other Ambulatory Visit: Payer: Self-pay

## 2019-07-04 VITALS — BP 109/63 | HR 78 | Wt 159.4 lb

## 2019-07-04 DIAGNOSIS — Z3483 Encounter for supervision of other normal pregnancy, third trimester: Secondary | ICD-10-CM

## 2019-07-04 DIAGNOSIS — Z20822 Contact with and (suspected) exposure to covid-19: Secondary | ICD-10-CM

## 2019-07-04 NOTE — Patient Instructions (Signed)
Braxton Hicks Contractions °Contractions of the uterus can occur throughout pregnancy, but they are not always a sign that you are in labor. You may have practice contractions called Braxton Hicks contractions. These false labor contractions are sometimes confused with true labor. °What are Braxton Hicks contractions? °Braxton Hicks contractions are tightening movements that occur in the muscles of the uterus before labor. Unlike true labor contractions, these contractions do not result in opening (dilation) and thinning of the cervix. Toward the end of pregnancy (32-34 weeks), Braxton Hicks contractions can happen more often and may become stronger. These contractions are sometimes difficult to tell apart from true labor because they can be very uncomfortable. You should not feel embarrassed if you go to the hospital with false labor. °Sometimes, the only way to tell if you are in true labor is for your health care provider to look for changes in the cervix. The health care provider will do a physical exam and may monitor your contractions. If you are not in true labor, the exam should show that your cervix is not dilating and your water has not broken. °If there are no other health problems associated with your pregnancy, it is completely safe for you to be sent home with false labor. You may continue to have Braxton Hicks contractions until you go into true labor. °How to tell the difference between true labor and false labor °True labor °· Contractions last 30-70 seconds. °· Contractions become very regular. °· Discomfort is usually felt in the top of the uterus, and it spreads to the lower abdomen and low back. °· Contractions do not go away with walking. °· Contractions usually become more intense and increase in frequency. °· The cervix dilates and gets thinner. °False labor °· Contractions are usually shorter and not as strong as true labor contractions. °· Contractions are usually irregular. °· Contractions  are often felt in the front of the lower abdomen and in the groin. °· Contractions may go away when you walk around or change positions while lying down. °· Contractions get weaker and are shorter-lasting as time goes on. °· The cervix usually does not dilate or become thin. °Follow these instructions at home: ° °· Take over-the-counter and prescription medicines only as told by your health care provider. °· Keep up with your usual exercises and follow other instructions from your health care provider. °· Eat and drink lightly if you think you are going into labor. °· If Braxton Hicks contractions are making you uncomfortable: °? Change your position from lying down or resting to walking, or change from walking to resting. °? Sit and rest in a tub of warm water. °? Drink enough fluid to keep your urine pale yellow. Dehydration may cause these contractions. °? Do slow and deep breathing several times an hour. °· Keep all follow-up prenatal visits as told by your health care provider. This is important. °Contact a health care provider if: °· You have a fever. °· You have continuous pain in your abdomen. °Get help right away if: °· Your contractions become stronger, more regular, and closer together. °· You have fluid leaking or gushing from your vagina. °· You pass blood-tinged mucus (bloody show). °· You have bleeding from your vagina. °· You have low back pain that you never had before. °· You feel your baby’s head pushing down and causing pelvic pressure. °· Your baby is not moving inside you as much as it used to. °Summary °· Contractions that occur before labor are   called Braxton Hicks contractions, false labor, or practice contractions. °· Braxton Hicks contractions are usually shorter, weaker, farther apart, and less regular than true labor contractions. True labor contractions usually become progressively stronger and regular, and they become more frequent. °· Manage discomfort from Braxton Hicks contractions  by changing position, resting in a warm bath, drinking plenty of water, or practicing deep breathing. °This information is not intended to replace advice given to you by your health care provider. Make sure you discuss any questions you have with your health care provider. °Document Revised: 05/04/2017 Document Reviewed: 10/05/2016 °Elsevier Patient Education © 2020 Elsevier Inc. ° °

## 2019-07-04 NOTE — Progress Notes (Signed)
ROB doing well. Feels good movement. GBS /cultures and cbc-anemia  today. Discussed labor precautions . SVE 2/50/-2. Follow up 1 wk.   Doreene Burke, CNM

## 2019-07-05 LAB — CBC
Hematocrit: 27.1 % — ABNORMAL LOW (ref 34.0–46.6)
Hemoglobin: 8.5 g/dL — ABNORMAL LOW (ref 11.1–15.9)
MCH: 21.8 pg — ABNORMAL LOW (ref 26.6–33.0)
MCHC: 31.4 g/dL — ABNORMAL LOW (ref 31.5–35.7)
MCV: 70 fL — ABNORMAL LOW (ref 79–97)
NRBC: 1 % — ABNORMAL HIGH (ref 0–0)
Platelets: 175 10*3/uL (ref 150–450)
RBC: 3.9 x10E6/uL (ref 3.77–5.28)
RDW: 16.3 % — ABNORMAL HIGH (ref 11.7–15.4)
WBC: 4.6 10*3/uL (ref 3.4–10.8)

## 2019-07-05 LAB — NOVEL CORONAVIRUS, NAA: SARS-CoV-2, NAA: NOT DETECTED

## 2019-07-06 LAB — GC/CHLAMYDIA PROBE AMP
Chlamydia trachomatis, NAA: NEGATIVE
Neisseria Gonorrhoeae by PCR: NEGATIVE

## 2019-07-08 LAB — STREP GP B CULTURE+RFLX: Strep Gp B Culture+Rflx: NEGATIVE

## 2019-07-14 ENCOUNTER — Other Ambulatory Visit: Payer: Self-pay

## 2019-07-14 ENCOUNTER — Inpatient Hospital Stay
Admission: EM | Admit: 2019-07-14 | Discharge: 2019-07-16 | DRG: 805 | Disposition: A | Discharge status: Home / Self Care | Payer: Managed Care, Other (non HMO) | Attending: Certified Nurse Midwife | Admitting: Certified Nurse Midwife

## 2019-07-14 ENCOUNTER — Encounter: Payer: Self-pay | Admitting: Obstetrics and Gynecology

## 2019-07-14 ENCOUNTER — Ambulatory Visit (INDEPENDENT_AMBULATORY_CARE_PROVIDER_SITE_OTHER): Payer: Managed Care, Other (non HMO) | Admitting: Certified Nurse Midwife

## 2019-07-14 ENCOUNTER — Encounter: Payer: Self-pay | Admitting: Certified Nurse Midwife

## 2019-07-14 VITALS — BP 110/64 | HR 79 | Wt 161.3 lb

## 2019-07-14 DIAGNOSIS — Z3483 Encounter for supervision of other normal pregnancy, third trimester: Secondary | ICD-10-CM

## 2019-07-14 DIAGNOSIS — O9852 Other viral diseases complicating childbirth: Principal | ICD-10-CM | POA: Diagnosis present

## 2019-07-14 DIAGNOSIS — O4440 Low lying placenta NOS or without hemorrhage, unspecified trimester: Secondary | ICD-10-CM

## 2019-07-14 DIAGNOSIS — O26893 Other specified pregnancy related conditions, third trimester: Secondary | ICD-10-CM | POA: Diagnosis present

## 2019-07-14 DIAGNOSIS — Z3A38 38 weeks gestation of pregnancy: Secondary | ICD-10-CM

## 2019-07-14 DIAGNOSIS — U071 COVID-19: Secondary | ICD-10-CM | POA: Diagnosis present

## 2019-07-14 LAB — POCT URINALYSIS DIPSTICK OB
Bilirubin, UA: NEGATIVE
Blood, UA: NEGATIVE
Glucose, UA: NEGATIVE
Ketones, UA: NEGATIVE
Leukocytes, UA: NEGATIVE
Nitrite, UA: NEGATIVE
POC,PROTEIN,UA: NEGATIVE
Spec Grav, UA: 1.02 (ref 1.010–1.025)
Urobilinogen, UA: 0.2 E.U./dL
pH, UA: 5 (ref 5.0–8.0)

## 2019-07-14 LAB — CBC
HCT: 33.6 % — ABNORMAL LOW (ref 36.0–46.0)
Hemoglobin: 9.7 g/dL — ABNORMAL LOW (ref 12.0–15.0)
MCH: 21.7 pg — ABNORMAL LOW (ref 26.0–34.0)
MCHC: 28.9 g/dL — ABNORMAL LOW (ref 30.0–36.0)
MCV: 75.2 fL — ABNORMAL LOW (ref 80.0–100.0)
Platelets: 189 10*3/uL (ref 150–400)
RBC: 4.47 MIL/uL (ref 3.87–5.11)
RDW: 21.7 % — ABNORMAL HIGH (ref 11.5–15.5)
WBC: 10.6 10*3/uL — ABNORMAL HIGH (ref 4.0–10.5)
nRBC: 0.5 % — ABNORMAL HIGH (ref 0.0–0.2)

## 2019-07-14 MED ORDER — OXYTOCIN 40 UNITS IN NORMAL SALINE INFUSION - SIMPLE MED: 2.5000 [IU]/h | INTRAVENOUS | Status: DC

## 2019-07-14 MED ORDER — LIDOCAINE HCL (PF) 1 % IJ SOLN
INTRAMUSCULAR | Status: AC
  Filled 2019-07-14: qty 30

## 2019-07-14 MED ORDER — LIDOCAINE HCL (PF) 1 % IJ SOLN: 30.0000 mL | INTRAMUSCULAR | Status: DC | PRN

## 2019-07-14 MED ORDER — BUTORPHANOL TARTRATE 1 MG/ML IJ SOLN: 1.0000 mg | INTRAMUSCULAR | Status: DC | PRN

## 2019-07-14 MED ORDER — ACETAMINOPHEN 325 MG PO TABS: 650.0000 mg | ORAL_TABLET | ORAL | Status: DC | PRN

## 2019-07-14 MED ORDER — SOD CITRATE-CITRIC ACID 500-334 MG/5ML PO SOLN: 30.0000 mL | ORAL | Status: DC | PRN

## 2019-07-14 MED ORDER — LACTATED RINGERS IV SOLN: INTRAVENOUS | Status: DC

## 2019-07-14 MED ORDER — OXYCODONE-ACETAMINOPHEN 5-325 MG PO TABS: 2.0000 | ORAL_TABLET | ORAL | Status: DC | PRN

## 2019-07-14 MED ORDER — OXYTOCIN 40 UNITS IN NORMAL SALINE INFUSION - SIMPLE MED
INTRAVENOUS | Status: AC
  Filled 2019-07-14: qty 1000

## 2019-07-14 MED ORDER — ONDANSETRON HCL 4 MG/2ML IJ SOLN: 4.0000 mg | Freq: Four times a day (QID) | INTRAMUSCULAR | Status: DC | PRN

## 2019-07-14 MED ORDER — OXYCODONE-ACETAMINOPHEN 5-325 MG PO TABS: 1.0000 | ORAL_TABLET | ORAL | Status: DC | PRN

## 2019-07-14 MED ORDER — OXYTOCIN 10 UNIT/ML IJ SOLN
INTRAMUSCULAR | Status: AC
  Filled 2019-07-14: qty 2

## 2019-07-14 MED ORDER — MISOPROSTOL 200 MCG PO TABS
ORAL_TABLET | ORAL | Status: AC
  Filled 2019-07-14: qty 4

## 2019-07-14 MED ORDER — OXYTOCIN BOLUS FROM INFUSION
500.0000 mL | Freq: Once | INTRAVENOUS | Status: AC
  Administered 2019-07-15: 500 mL via INTRAVENOUS

## 2019-07-14 MED ORDER — AMMONIA AROMATIC IN INHA
RESPIRATORY_TRACT | Status: AC
  Filled 2019-07-14: qty 10

## 2019-07-14 MED ORDER — LACTATED RINGERS IV SOLN: 500.0000 mL | INTRAVENOUS | Status: DC | PRN

## 2019-07-14 NOTE — OB Triage Note (Signed)
Pt presents to unit c/o ctx that have been going on for a couple of days now but have gotten stronger since 6pm tonight. Pt rates pain 8/10. Pt reports +FM, denies LOF, and vaginal bleeding. Vitals WDL. Initials FHT 130s. Will continue to monitor.

## 2019-07-14 NOTE — Patient Instructions (Signed)
Braxton Hicks Contractions °Contractions of the uterus can occur throughout pregnancy, but they are not always a sign that you are in labor. You may have practice contractions called Braxton Hicks contractions. These false labor contractions are sometimes confused with true labor. °What are Braxton Hicks contractions? °Braxton Hicks contractions are tightening movements that occur in the muscles of the uterus before labor. Unlike true labor contractions, these contractions do not result in opening (dilation) and thinning of the cervix. Toward the end of pregnancy (32-34 weeks), Braxton Hicks contractions can happen more often and may become stronger. These contractions are sometimes difficult to tell apart from true labor because they can be very uncomfortable. You should not feel embarrassed if you go to the hospital with false labor. °Sometimes, the only way to tell if you are in true labor is for your health care provider to look for changes in the cervix. The health care provider will do a physical exam and may monitor your contractions. If you are not in true labor, the exam should show that your cervix is not dilating and your water has not broken. °If there are no other health problems associated with your pregnancy, it is completely safe for you to be sent home with false labor. You may continue to have Braxton Hicks contractions until you go into true labor. °How to tell the difference between true labor and false labor °True labor °· Contractions last 30-70 seconds. °· Contractions become very regular. °· Discomfort is usually felt in the top of the uterus, and it spreads to the lower abdomen and low back. °· Contractions do not go away with walking. °· Contractions usually become more intense and increase in frequency. °· The cervix dilates and gets thinner. °False labor °· Contractions are usually shorter and not as strong as true labor contractions. °· Contractions are usually irregular. °· Contractions  are often felt in the front of the lower abdomen and in the groin. °· Contractions may go away when you walk around or change positions while lying down. °· Contractions get weaker and are shorter-lasting as time goes on. °· The cervix usually does not dilate or become thin. °Follow these instructions at home: ° °· Take over-the-counter and prescription medicines only as told by your health care provider. °· Keep up with your usual exercises and follow other instructions from your health care provider. °· Eat and drink lightly if you think you are going into labor. °· If Braxton Hicks contractions are making you uncomfortable: °? Change your position from lying down or resting to walking, or change from walking to resting. °? Sit and rest in a tub of warm water. °? Drink enough fluid to keep your urine pale yellow. Dehydration may cause these contractions. °? Do slow and deep breathing several times an hour. °· Keep all follow-up prenatal visits as told by your health care provider. This is important. °Contact a health care provider if: °· You have a fever. °· You have continuous pain in your abdomen. °Get help right away if: °· Your contractions become stronger, more regular, and closer together. °· You have fluid leaking or gushing from your vagina. °· You pass blood-tinged mucus (bloody show). °· You have bleeding from your vagina. °· You have low back pain that you never had before. °· You feel your baby’s head pushing down and causing pelvic pressure. °· Your baby is not moving inside you as much as it used to. °Summary °· Contractions that occur before labor are   called Braxton Hicks contractions, false labor, or practice contractions. °· Braxton Hicks contractions are usually shorter, weaker, farther apart, and less regular than true labor contractions. True labor contractions usually become progressively stronger and regular, and they become more frequent. °· Manage discomfort from Braxton Hicks contractions  by changing position, resting in a warm bath, drinking plenty of water, or practicing deep breathing. °This information is not intended to replace advice given to you by your health care provider. Make sure you discuss any questions you have with your health care provider. °Document Revised: 05/04/2017 Document Reviewed: 10/05/2016 °Elsevier Patient Education © 2020 Elsevier Inc. ° °

## 2019-07-14 NOTE — Addendum Note (Signed)
Addended by: Brooke Dare on: 07/14/2019 03:28 PM   Modules accepted: Orders

## 2019-07-14 NOTE — Progress Notes (Signed)
ROB doing well. Feels good movement. Has had irregular contractions .SVE per pt request 3-4/70/-2 . Labor precautions reviewed. Iron/Vit b-12 and cbc ordered. Will follow up with results. ROB 1 wk with Marcelino Duster.   Doreene Burke, CNM

## 2019-07-14 NOTE — Lactation Note (Signed)
Lactation Consultation Note  Patient Name: Kristine King [id number] Date: 07/14/2019   Lactation student discussed benefits of breastfeeding per the Ready, Set, Baby curriculum. [facility] encouraged to review breastfeeding information on Ready, set, [employer] site and given information for virtual breastfeeding classes.   Maternal Data    Feeding    LATCH Score                   Interventions    Lactation Tools Discussed/Used     Consult Status      [name] 07/14/2019, 2:07 PM

## 2019-07-15 ENCOUNTER — Encounter: Payer: Self-pay | Admitting: Obstetrics and Gynecology

## 2019-07-15 DIAGNOSIS — U071 COVID-19: Secondary | ICD-10-CM | POA: Diagnosis present

## 2019-07-15 DIAGNOSIS — Z3A38 38 weeks gestation of pregnancy: Secondary | ICD-10-CM

## 2019-07-15 LAB — CBC
Hematocrit: 31.7 % — ABNORMAL LOW (ref 34.0–46.6)
Hemoglobin: 9.8 g/dL — ABNORMAL LOW (ref 11.1–15.9)
MCH: 22.6 pg — ABNORMAL LOW (ref 26.6–33.0)
MCHC: 30.9 g/dL — ABNORMAL LOW (ref 31.5–35.7)
MCV: 73 fL — ABNORMAL LOW (ref 79–97)
NRBC: 1 % — ABNORMAL HIGH (ref 0–0)
Platelets: 170 10*3/uL (ref 150–450)
RBC: 4.34 x10E6/uL (ref 3.77–5.28)
RDW: 20.3 % — ABNORMAL HIGH (ref 11.7–15.4)
WBC: 5.9 10*3/uL (ref 3.4–10.8)

## 2019-07-15 LAB — RESPIRATORY PANEL BY RT PCR (FLU A&B, COVID)
Influenza A by PCR: NEGATIVE
Influenza B by PCR: NEGATIVE
SARS Coronavirus 2 by RT PCR: POSITIVE — AB

## 2019-07-15 LAB — IRON: Iron: 182 ug/dL — ABNORMAL HIGH (ref 27–159)

## 2019-07-15 LAB — VITAMIN B12: Vitamin B-12: 416 pg/mL (ref 232–1245)

## 2019-07-15 LAB — TYPE AND SCREEN
ABO/RH(D): O POS
Antibody Screen: NEGATIVE

## 2019-07-15 LAB — RPR: RPR Ser Ql: NONREACTIVE

## 2019-07-15 MED ORDER — ACETAMINOPHEN 325 MG PO TABS: 650.0000 mg | ORAL_TABLET | ORAL | Status: DC | PRN

## 2019-07-15 MED ORDER — ONDANSETRON HCL 4 MG PO TABS: 4.0000 mg | ORAL_TABLET | ORAL | Status: DC | PRN

## 2019-07-15 MED ORDER — SENNOSIDES-DOCUSATE SODIUM 8.6-50 MG PO TABS
2.0000 | ORAL_TABLET | ORAL | Status: DC
  Administered 2019-07-16: 2 via ORAL
  Filled 2019-07-15: qty 2

## 2019-07-15 MED ORDER — WITCH HAZEL-GLYCERIN EX PADS: 1.0000 "application " | MEDICATED_PAD | CUTANEOUS | Status: DC | PRN

## 2019-07-15 MED ORDER — ONDANSETRON HCL 4 MG/2ML IJ SOLN: 4.0000 mg | INTRAMUSCULAR | Status: DC | PRN

## 2019-07-15 MED ORDER — SIMETHICONE 80 MG PO CHEW: 80.0000 mg | CHEWABLE_TABLET | ORAL | Status: DC | PRN

## 2019-07-15 MED ORDER — COCONUT OIL OIL: 1.0000 "application " | TOPICAL_OIL | Status: DC | PRN

## 2019-07-15 MED ORDER — DIBUCAINE (PERIANAL) 1 % EX OINT: 1.0000 "application " | TOPICAL_OINTMENT | CUTANEOUS | Status: DC | PRN

## 2019-07-15 MED ORDER — BENZOCAINE-MENTHOL 20-0.5 % EX AERO
INHALATION_SPRAY | CUTANEOUS | Status: AC
  Filled 2019-07-15: qty 56

## 2019-07-15 MED ORDER — IBUPROFEN 600 MG PO TABS
600.0000 mg | ORAL_TABLET | Freq: Four times a day (QID) | ORAL | Status: DC
  Administered 2019-07-15 – 2019-07-16 (×3): 600 mg via ORAL
  Filled 2019-07-15 (×4): qty 1

## 2019-07-15 MED ORDER — DIPHENHYDRAMINE HCL 25 MG PO CAPS: 25.0000 mg | ORAL_CAPSULE | Freq: Four times a day (QID) | ORAL | Status: DC | PRN

## 2019-07-15 MED ORDER — BENZOCAINE-MENTHOL 20-0.5 % EX AERO
1.0000 "application " | INHALATION_SPRAY | CUTANEOUS | Status: DC | PRN
  Administered 2019-07-15: 1 via TOPICAL

## 2019-07-15 MED ORDER — PRENATAL MULTIVITAMIN CH
1.0000 | ORAL_TABLET | Freq: Every day | ORAL | Status: DC
  Administered 2019-07-15 – 2019-07-16 (×2): 1 via ORAL
  Filled 2019-07-15 (×2): qty 1

## 2019-07-15 MED ORDER — ZOLPIDEM TARTRATE 5 MG PO TABS: 5.0000 mg | ORAL_TABLET | Freq: Every evening | ORAL | Status: DC | PRN

## 2019-07-15 NOTE — H&P (Signed)
Obstetric History and Physical  Kristine King is a 29 y.o. [id number] with IUP at [redacted]w[redacted]d presenting for complaints of contractions for several days, however worsening in intensity after 6 pm tonight. Patient states she has been having  regular, every 3-4 minutes contractions, none vaginal bleeding, intact membranes, with active fetal movement.    Prenatal Course Source of Care: [facility] (midwifery service) with onset of care at 9 weeks Pregnancy complications or risks: Patient Active Problem List   Diagnosis Date Noted  . Labor and delivery indication for care or intervention 07/14/2019  . Carpal tunnel syndrome during pregnancy 03/06/2019  . Pregnancy in multigravida 01/10/2019   She plans to breastfeed She desires undecided method for postpartum contraception.   Prenatal labs and studies: ABO, Rh: --/--/O POS (02/09 0026) Antibody: NEG (02/09 0026) Rubella: 2.31 (07/17 1430) RPR: Non Reactive (12/01 0953)  HBsAg: Negative (07/17 1430)  HIV: Non Reactive (07/17 1430)  UUV:OZDGUYQI/-- (01/29 1107) 1 hr Glucola  Normal (101) Genetic screening normal Anatomy US abnormal with low-lying placenta, resolved by 28 week ultrasound.    Past Medical History:  Diagnosis Date  . Allergy   . Anemia     Past Surgical History:  Procedure Laterality Date  . NO PAST SURGERIES    . none      OB History  Gravida Para Term Preterm AB Living  3 2 2     2   SAB TAB Ectopic Multiple Live Births          2    # Outcome Date GA Lbr Len/2nd Weight Sex Delivery Anes PTL Lv  3 Current           2 Term 02/02/17   3856 g M Vag-Spont  N LIV  1 Term 12/16/09   3005 g F Vag-Spont  N LIV    Social History   Socioeconomic History  . Marital status: Married    Spouse name: Roselie Awkward  . Number of children: 1  . Years of education: Not on file  . Highest education level: Not on file  Occupational History  . Occupation: patient billiing  Tobacco Use  . Smoking status: Never Smoker   . Smokeless tobacco: Never Used  Substance and Sexual Activity  . Alcohol use: No  . Drug use: No  . Sexual activity: Yes    Partners: Male    Birth control/protection: None  Other Topics Concern  . Not on file  Social History Narrative  . Not on file   Social Determinants of Health   Financial Resource Strain:   . Difficulty of Paying Living Expenses: Not on file  Food Insecurity:   . Worried About Charity fundraiser in the Last Year: Not on file  . Ran Out of Food in the Last Year: Not on file  Transportation Needs:   . Lack of Transportation (Medical): Not on file  . Lack of Transportation (Non-Medical): Not on file  Physical Activity:   . Days of Exercise per Week: Not on file  . Minutes of Exercise per Session: Not on file  Stress:   . Feeling of Stress : Not on file  Social Connections:   . Frequency of Communication with Friends and Family: Not on file  . Frequency of Social Gatherings with Friends and Family: Not on file  . Attends Religious Services: Not on file  . Active Member of Clubs or Organizations: Not on file  . Attends Archivist Meetings: Not on file  .  Marital Status: Not on file    Family History  Problem Relation Age of Onset  . Diabetes Paternal Grandfather   . Breast cancer Neg Hx   . Ovarian cancer Neg Hx   . Colon cancer Neg Hx     Medications Prior to Admission  Medication Sig Dispense Refill Last Dose  . Iron-FA-B Cmp-C-Biot-Probiotic (FUSION PLUS) CAPS Take 1 tablet by mouth daily. 30 capsule 4 07/14/2019 at Unknown time  . Prenatal Vit-Fe Fumarate-FA (PRENATAL MULTIVITAMIN) TABS tablet Take 1 tablet by mouth daily at 12 noon.   07/13/2019 at Unknown time    Allergies  Allergen Reactions  . Meloxicam Swelling  . Penicillin G Hives    Review of Systems: Negative except for what is mentioned in HPI.  Physical Exam: BP (!) 120/51   Pulse 81   Temp 98.7 F (37.1 C) (Oral)   Resp 20   Ht 5\' 2"  (1.575 m)   Wt 73 kg    LMP 10/14/2018 (Exact Date)   BMI 29.45 kg/m  CONSTITUTIONAL: Well-developed, well-nourished female in no acute distress.  HENT:  Normocephalic, atraumatic, External right and left ear normal. Oropharynx is clear and moist EYES: Conjunctivae and EOM are normal. Pupils are equal, round, and reactive to light. No scleral icterus.  NECK: Normal range of motion, supple, no masses SKIN: Skin is warm and dry. No rash noted. Not diaphoretic. No erythema. No pallor. NEUROLOGIC: Alert and oriented to person, place, and time. Normal reflexes, muscle tone coordination. No cranial nerve deficit noted. PSYCHIATRIC: Normal mood and affect. Normal behavior. Normal judgment and thought content. CARDIOVASCULAR: Normal heart rate noted, regular rhythm RESPIRATORY: Effort and breath sounds normal, no problems with respiration noted ABDOMEN: Soft, nontender, nondistended, gravid. MUSCULOSKELETAL: Normal range of motion. No edema and no tenderness. 2+ distal pulses.  Cervical Exam: Dilatation 6.5 cm   Effacement 80%   Station -2   Presentation: cephalic FHT:  Baseline rate 125 bpm   Variability moderate  Accelerations present   Decelerations none Contractions: Every 3 mins   Pertinent Labs/Studies:   Results for orders placed or performed during the hospital encounter of 07/14/19 (from the past 24 hour(s))  CBC     Status: Abnormal   Collection Time: 07/14/19 11:18 PM  Result Value Ref Range   WBC 10.6 (H) 4.0 - 10.5 K/uL   RBC 4.47 3.87 - 5.11 MIL/uL   Hemoglobin 9.7 (L) 12.0 - 15.0 g/dL   HCT 09/11/19 (L) 38.7 - 56.4 %   MCV 75.2 (L) 80.0 - 100.0 fL   MCH 21.7 (L) 26.0 - 34.0 pg   MCHC 28.9 (L) 30.0 - 36.0 g/dL   RDW 33.2 (H) 95.1 - 88.4 %   Platelets 189 150 - 400 K/uL   nRBC 0.5 (H) 0.0 - 0.2 %  Type and screen [facility]     Status: None (Preliminary result)   Collection Time: 07/14/19 11:18 PM  Result Value Ref Range   ABO/RH(D) PENDING    Antibody Screen PENDING    Sample  Expiration      07/17/2019,2359 Performed at [facility], [address] [place], [place]   Respiratory Panel by RT PCR (Flu A&B, Covid) - Nasopharyngeal Swab     Status: Abnormal   Collection Time: 07/14/19 11:25 PM   Specimen: Nasopharyngeal Swab  Result Value Ref Range   SARS Coronavirus 2 by RT PCR POSITIVE (A) NEGATIVE   Influenza A by PCR NEGATIVE NEGATIVE   Influenza  B by PCR NEGATIVE NEGATIVE  Type and screen     Status: None   Collection Time: 07/15/19 12:26 AM  Result Value Ref Range   ABO/RH(D) O POS    Antibody Screen NEG    Sample Expiration      07/18/2019,2359 Performed at [facility], [address], [place], [place] [address]     Assessment : Maicy Filip is a 29 y.o. [id number] at [redacted]w[redacted]d being admitted for labor. History of anemia in pregnancy. Currently COVID + on admission screening, asymptomatic.   Plan: Labor: Expectant management. Augmentation as ordered as per protocol. Analgesia as needed. Declines epidural at this time.  FWB: Reassuring fetal heart tracing.  GBS negative Delivery plan: Hopeful for vaginal delivery. Attempt to limit blood loss with active management of 3rd stage of labor due to anemia.    Hildred Laser, MD [facility]

## 2019-07-16 LAB — CBC
HCT: 32.6 % — ABNORMAL LOW (ref 36.0–46.0)
Hemoglobin: 9.6 g/dL — ABNORMAL LOW (ref 12.0–15.0)
MCH: 22.2 pg — ABNORMAL LOW (ref 26.0–34.0)
MCHC: 29.4 g/dL — ABNORMAL LOW (ref 30.0–36.0)
MCV: 75.5 fL — ABNORMAL LOW (ref 80.0–100.0)
Platelets: 161 10*3/uL (ref 150–400)
RBC: 4.32 MIL/uL (ref 3.87–5.11)
RDW: 23.1 % — ABNORMAL HIGH (ref 11.5–15.5)
WBC: 8.5 10*3/uL (ref 4.0–10.5)
nRBC: 0 % (ref 0.0–0.2)

## 2019-07-16 MED ORDER — IBUPROFEN 600 MG PO TABS: 600.0000 mg | ORAL_TABLET | Freq: Four times a day (QID) | ORAL | 0 refills | Status: DC

## 2019-07-16 MED ORDER — DOCUSATE SODIUM 100 MG PO CAPS: 100.0000 mg | ORAL_CAPSULE | Freq: Every day | ORAL | 2 refills | Status: AC | PRN

## 2019-07-16 MED ORDER — NORETHINDRONE 0.35 MG PO TABS: 1.0000 | ORAL_TABLET | Freq: Every day | ORAL | 11 refills | Status: AC

## 2019-07-16 NOTE — Progress Notes (Signed)
Discharge instructions, appointments, and prescriptions given and explained. Pt verbalized understanding with no further questions. Pt wheeled to personal vehicle with infant and all belongings via staff.

## 2019-07-16 NOTE — Discharge Summary (Signed)
                              Discharge Summary  Date of Admission: 07/14/2019  Date of Discharge: 07/16/2019  Admitting Diagnosis: Onset of Labor at [redacted]w[redacted]d  Mode of Delivery: normal spontaneous vaginal delivery                 Discharge Diagnosis: Covid positive   Intrapartum Procedures: none   Post partum procedures: none  Complications: none                      Discharge Day SOAP Note:  Progress Note - Vaginal Delivery  Kristine King is a 29 y.o. G3P3003 now PP day 1 s/p Vaginal, Spontaneous . Delivery was uncomplicated  Subjective  The patient has the following complaints: has no unusual complaints  Pain is controlled with current medications.   Patient is urinating without difficulty.  She is ambulating well.     Objective  Vital signs: BP 104/69   Pulse 72   Temp 98.2 F (36.8 C) (Oral)   Resp 18   Ht 5\' 2"  (1.575 m)   Wt 73 kg   LMP 10/14/2018 (Exact Date)   Breastfeeding Unknown   BMI 29.45 kg/m   Physical Exam: Gen: NAD Fundus Fundal Tone: Firm  Lochia Amount: Scant  Perineum Appearance: Edematous, Intact     Data Review Labs: CBC Latest Ref Rng & Units 07/16/2019 07/14/2019 07/14/2019  WBC 4.0 - 10.5 K/uL 8.5 10.6(H) 5.9  Hemoglobin 12.0 - 15.0 g/dL 09/11/2019) 2.3(N) 3.6(R)  Hematocrit 36.0 - 46.0 % 32.6(L) 33.6(L) 31.7(L)  Platelets 150 - 400 K/uL 161 189 170   O POS  Assessment/Plan  Active Problems:   Indication for care/intervention related to labor/delivery, antepartum   Labor and delivery indication for care or intervention   COVID-19 virus detected    Plan for discharge today.   Discharge Instructions: Per After Visit Summary. Activity: Advance as tolerated. Pelvic rest for 6 weeks.  Also refer to After Visit Summary Diet: Regular Medications: Allergies as of 07/16/2019      Reactions   Meloxicam Swelling   Penicillin G Hives      Medication List    TAKE these medications   docusate sodium 100 MG capsule Commonly known  as: Colace Take 1 capsule (100 mg total) by mouth daily as needed.   Fusion Plus Caps Take 1 tablet by mouth daily.   ibuprofen 600 MG tablet Commonly known as: ADVIL Take 1 tablet (600 mg total) by mouth every 6 (six) hours.   norethindrone 0.35 MG tablet Commonly known as: MICRONOR Take 1 tablet (0.35 mg total) by mouth daily. Start at 4 wks postpartum   prenatal multivitamin Tabs tablet Take 1 tablet by mouth daily at 12 noon.      Outpatient follow up: 09/13/2019, CNM @ 6 wks  Postpartum contraception: Progestin only pill   Discharged Condition: good  Discharged to: home  Newborn Data: Disposition:home with mother  Apgars: APGAR (1 MIN): 8   APGAR (5 MINS): 9   APGAR (10 MINS):    Baby Feeding: Breast    Doreene Burke, CNM  07/16/2019 8:10 AM

## 2019-07-16 NOTE — Lactation Note (Signed)
This note was copied from a baby's chart. Lactation Consultation Note  Patient Name: Boy Miya Luviano [id number] Date: 07/16/2019 Reason for consult: Initial assessment;Mother's request  LC called into room for mom and baby. This is mom's third baby, with breastfeeding history of 6 months and over 1 year with last baby. Assigned Transition RN and LD RN reports mom desire to give formula, concerned about baby getting enough to be full due to cluster feeding overnight. Mom also reports beginning of sore nipples due to "constant feeding".  LC into room with baby at right breast in cradle position with clothing and blanket and hat, mom reporting feeding for almost 10 minutes at that time. Mom was attempting to wake baby, reporting this behavior was the norm for baby unless removed from breast when he begins to cry. LC educated mom on importance of stimulation during feedings, encouraged to feed in just a diaper, breast compression and massage while nursing to keep baby alert/awake. LC assisted with removal of baby from breast, removed blanket and shirt, and returned to mom who independently re-latched baby on right breast. Baby began strong rhythmic sucking, and after 8 minutes removed self from breast. LC provided education on growth spurts and cluster feeding being normal during night, and early morning hours. Provided guidance on importance of putting baby to breast when cuing, and keeping alert/awake throughout entire feed. Encouraged to track wet/stool diapers as a way to measure intake, ways to look at baby's body language during feeds- tense vs relaxed, open vs closed hands. Information given for breastfeeding expectations in the following days and upcoming 2-3 weeks.  Mom reports feeling reassured after visit, and remembers some of the same things happening with her other children now that they had been reviewed with her. Mom did ask a question re: formula introduction- LC provided guidance on the  impact that formula may have on the establishment of adequate milk supply, giving reassurance of normal newborn behavior and feeding patterns. Mom asked about formula again being given overnight to "help", LC did encourage to continue to offer breast first before giving formula, and provided guidance for paced-bottle feeding if bottle to be given.  Mom and Dad had no further questions or concerns. Information given for outpatient lactation consult support, and community breastfeeding resources post discharge.  Maternal Data Formula Feeding for Exclusion: No Has patient been taught Hand Expression?: Yes Does the patient have breastfeeding experience prior to this delivery?: Yes  Feeding Feeding Type: Breast Fed  LATCH Score Latch: Grasps breast easily, tongue down, lips flanged, rhythmical sucking.  Audible Swallowing: Spontaneous and intermittent  Type of Nipple: Everted at rest and after stimulation  Comfort (Breast/Nipple): Filling, red/small blisters or bruises, mild/mod discomfort(sore/tender)  Hold (Positioning): No assistance needed to correctly position infant at breast.  LATCH Score: 9  Interventions Interventions: Breast feeding basics reviewed;Support pillows;Adjust position;Comfort gels  Lactation Tools Discussed/Used Tools: Comfort gels   Consult Status Consult Status: Complete    Danford Bad 07/16/2019, 10:37 AM

## 2019-07-16 NOTE — Final Progress Note (Signed)
Discharge Day SOAP Note:  Progress Note - Vaginal Delivery  Kristine King is a 29 y.o. G3P3003 now PP day 1 s/p Vaginal, Spontaneous . Delivery was uncomplicated  Subjective  The patient has the following complaints: has no unusual complaints  Pain is controlled with current medications.   Patient is urinating without difficulty.  She is ambulating well.     Objective  Vital signs: BP 104/69   Pulse 72   Temp 98.2 F (36.8 C) (Oral)   Resp 18   Ht 5\' 2"  (1.575 m)   Wt 73 kg   LMP 10/14/2018 (Exact Date)   Breastfeeding Unknown   BMI 29.45 kg/m   Physical Exam: Gen: NAD Fundus Fundal Tone: Firm  Lochia Amount: Scant  Perineum Appearance: Edematous, Intact     Data Review Labs: CBC Latest Ref Rng & Units 07/16/2019 07/14/2019 07/14/2019  WBC 4.0 - 10.5 K/uL 8.5 10.6(H) 5.9  Hemoglobin 12.0 - 15.0 g/dL 09/11/2019) 7.8(G) 9.5(A)  Hematocrit 36.0 - 46.0 % 32.6(L) 33.6(L) 31.7(L)  Platelets 150 - 400 K/uL 161 189 170   O POS  Assessment/Plan  Active Problems:   Indication for care/intervention related to labor/delivery, antepartum   Labor and delivery indication for care or intervention   COVID-19 virus detected    Plan for discharge today.   Discharge Instructions: Per After Visit Summary. Activity: Advance as tolerated. Pelvic rest for 6 weeks.  Also refer to After Visit Summary Diet: Regular Medications: Allergies as of 07/16/2019      Reactions   Meloxicam Swelling   Penicillin G Hives      Medication List    TAKE these medications   docusate sodium 100 MG capsule Commonly known as: Colace Take 1 capsule (100 mg total) by mouth daily as needed.   Fusion Plus Caps Take 1 tablet by mouth daily.   ibuprofen 600 MG tablet Commonly known as: ADVIL Take 1 tablet (600 mg total) by mouth every 6 (six) hours.   norethindrone 0.35 MG tablet Commonly known as: MICRONOR Take 1 tablet (0.35 mg total) by mouth daily. Start at 4 wks postpartum   prenatal  multivitamin Tabs tablet Take 1 tablet by mouth daily at 12 noon.      Outpatient follow up: 09/13/2019, CNM @ 6 wks  Postpartum contraception: Progestin only pill   Discharged Condition: good  Discharged to: home  Newborn Data: Disposition:home with mother  Apgars: APGAR (1 MIN): 8   APGAR (5 MINS): 9   APGAR (10 MINS):    Baby Feeding: Breast    Doreene Burke, CNM  07/16/2019 8:10 AM

## 2019-07-21 ENCOUNTER — Encounter: Payer: Managed Care, Other (non HMO) | Admitting: Certified Nurse Midwife

## 2019-07-25 ENCOUNTER — Encounter: Payer: Managed Care, Other (non HMO) | Admitting: Certified Nurse Midwife

## 2019-07-25 ENCOUNTER — Inpatient Hospital Stay: Admit: 2019-07-25 | Payer: Self-pay

## 2019-08-26 ENCOUNTER — Ambulatory Visit (INDEPENDENT_AMBULATORY_CARE_PROVIDER_SITE_OTHER): Payer: Managed Care, Other (non HMO) | Admitting: Certified Nurse Midwife

## 2019-08-26 ENCOUNTER — Encounter: Payer: Self-pay | Admitting: Certified Nurse Midwife

## 2019-08-26 ENCOUNTER — Other Ambulatory Visit: Payer: Self-pay

## 2019-08-26 NOTE — Patient Instructions (Signed)
Preventive Care 5-28 Years Old, Female Preventive care refers to visits with your health care provider and lifestyle choices that can promote health and wellness. This includes:  A yearly physical exam. This may also be called an annual well check.  Regular dental visits and eye exams.  Immunizations.  Screening for certain conditions.  Healthy lifestyle choices, such as eating a healthy diet, getting regular exercise, not using drugs or products that contain nicotine and tobacco, and limiting alcohol use. What can I expect for my preventive care visit? Physical exam Your health care provider will check your:  Height and weight. This may be used to calculate body mass index (BMI), which tells if you are at a healthy weight.  Heart rate and blood pressure.  Skin for abnormal spots. Counseling Your health care provider may ask you questions about your:  Alcohol, tobacco, and drug use.  Emotional well-being.  Home and relationship well-being.  Sexual activity.  Eating habits.  Work and work Statistician.  Method of birth control.  Menstrual cycle.  Pregnancy history. What immunizations do I need?  Influenza (flu) vaccine  This is recommended every year. Tetanus, diphtheria, and pertussis (Tdap) vaccine  You may need a Td booster every 10 years. Varicella (chickenpox) vaccine  You may need this if you have not been vaccinated. Human papillomavirus (HPV) vaccine  If recommended by your health care provider, you may need three doses over 6 months. Measles, mumps, and rubella (MMR) vaccine  You may need at least one dose of MMR. You may also need a second dose. Meningococcal conjugate (MenACWY) vaccine  One dose is recommended if you are age 29-21 years and a first-year college student living in a residence hall, or if you have one of several medical conditions. You may also need additional booster doses. Pneumococcal conjugate (PCV13) vaccine  You may need  this if you have certain conditions and were not previously vaccinated. Pneumococcal polysaccharide (PPSV23) vaccine  You may need one or two doses if you smoke cigarettes or if you have certain conditions. Hepatitis A vaccine  You may need this if you have certain conditions or if you travel or work in places where you may be exposed to hepatitis A. Hepatitis B vaccine  You may need this if you have certain conditions or if you travel or work in places where you may be exposed to hepatitis B. Haemophilus influenzae type b (Hib) vaccine  You may need this if you have certain conditions. You may receive vaccines as individual doses or as more than one vaccine together in one shot (combination vaccines). Talk with your health care provider about the risks and benefits of combination vaccines. What tests do I need?  Blood tests  Lipid and cholesterol levels. These may be checked every 5 years starting at age 65.  Hepatitis C test.  Hepatitis B test. Screening  Diabetes screening. This is done by checking your blood sugar (glucose) after you have not eaten for a while (fasting).  Sexually transmitted disease (STD) testing.  BRCA-related cancer screening. This may be done if you have a family history of breast, ovarian, tubal, or peritoneal cancers.  Pelvic exam and Pap test. This may be done every 3 years starting at age 47. Starting at age 60, this may be done every 5 years if you have a Pap test in combination with an HPV test. Talk with your health care provider about your test results, treatment options, and if necessary, the need for more tests.  Follow these instructions at home: Eating and drinking   Eat a diet that includes fresh fruits and vegetables, whole grains, lean protein, and low-fat dairy.  Take vitamin and mineral supplements as recommended by your health care provider.  Do not drink alcohol if: ? Your health care provider tells you not to drink. ? You are  pregnant, may be pregnant, or are planning to become pregnant.  If you drink alcohol: ? Limit how much you have to 0-1 drink a day. ? Be aware of how much alcohol is in your drink. In the [place] one drink equals one 12 oz bottle of beer (355 mL), one 5 oz glass of wine (148 mL), or one 1 oz glass of hard liquor (44 mL). Lifestyle  Take daily care of your teeth and gums.  Stay active. Exercise for at least 30 minutes on 5 or more days each week.  Do not use any products that contain nicotine or tobacco, such as cigarettes, e-cigarettes, and chewing tobacco. If you need help quitting, ask your health care provider.  If you are sexually active, practice safe sex. Use a condom or other form of birth control (contraception) in order to prevent pregnancy and STIs (sexually transmitted infections). If you plan to become pregnant, see your health care provider for a preconception visit. What's next?  Visit your health care provider once a year for a well check visit.  Ask your health care provider how often you should have your eyes and teeth checked.  Stay up to date on all vaccines. This information is not intended to replace advice given to you by your health care provider. Make sure you discuss any questions you have with your health care provider. Document Revised: 01/31/2018 Document Reviewed: 01/31/2018 Elsevier Patient Education  2020 [employer].

## 2019-08-26 NOTE — Progress Notes (Signed)
Subjective:    Kristine King is a 29 y.o. G2P3003 Hispanic female who presents for a postpartum visit. She is 6 weeks postpartum following a spontaneous vaginal delivery at 38.4 gestational weeks. Anesthesia: none. I have fully reviewed the prenatal and intrapartum course. Postpartum course has been WNL. Baby's course has been WNL. Baby is feeding by breast. Bleeding no bleeding. Bowel function is normal. Bladder function is normal. Patient is not sexually active.. Contraception method is OCP (estrogen/progesterone). Postpartum depression screening: negative. Score 1.  Last pap 07/28/2016 and was negative.  The following portions of the patient's history were reviewed and updated as appropriate: allergies, current medications, past medical history, past surgical history and problem list.  Review of Systems Pertinent items are noted in HPI.   Vitals:   08/26/19 1507  BP: (!) 106/48  Pulse: 60  Weight: 138 lb 7 oz (62.8 kg)  Height: 5\' 2"  (1.575 m)   No LMP recorded.  Objective:   General:  alert, cooperative and no distress   Breasts:  deferred, no complaints  Lungs: clear to auscultation bilaterally  Heart:  regular rate and rhythm  Abdomen: soft, nontender   Vulva: normal  Vagina: normal vagina  Cervix:  closed  Corpus: Well-involuted  Adnexa:  Non-palpable  Rectal Exam: no hemorrhoids        Assessment:   Postpartum exam 6 wks s/p SVD Breastfeeding Dpression screening Contraception counseling   Plan:  : OCP (estrogen/progesterone) Follow up in: 6 months for annual or earlier if needed  , CNM

## 2019-09-08 ENCOUNTER — Ambulatory Visit: Payer: Managed Care, Other (non HMO)

## 2019-09-08 ENCOUNTER — Ambulatory Visit: Payer: Managed Care, Other (non HMO) | Attending: Internal Medicine

## 2019-09-08 DIAGNOSIS — Z23 Encounter for immunization: Secondary | ICD-10-CM

## 2019-09-08 NOTE — Progress Notes (Signed)
   Covid-19 Vaccination Clinic  Name:  Kristine King    MRN: [id number] DOB: 11-17-1990  09/08/2019  Kristine King was observed post Covid-19 immunization for 15 minutes without incident. She was provided with Vaccine Information Sheet and instruction to access the V-Safe system.   Kristine King was instructed to call 911 with any severe reactions post vaccine: Marland Kitchen Difficulty breathing  . Swelling of face and throat  . A fast heartbeat  . A bad rash all over body  . Dizziness and weakness   Immunizations Administered    Name Date Dose VIS Date Route   Pfizer COVID-19 Vaccine 09/08/2019  6:49 PM 0.3 mL 05/16/2019 Intramuscular   Manufacturer: [employer], [employer]   Lot: [id number]   NDC: [address]-8

## 2019-09-29 ENCOUNTER — Ambulatory Visit: Payer: Managed Care, Other (non HMO) | Attending: Internal Medicine

## 2019-09-29 DIAGNOSIS — Z23 Encounter for immunization: Secondary | ICD-10-CM

## 2019-09-29 NOTE — Progress Notes (Signed)
   Covid-19 Vaccination Clinic  Name:  Kristine King    MRN: [id number] DOB: 12/24/1990  09/29/2019  Kristine King was observed post Covid-19 immunization for 15 minutes without incident. She was provided with Vaccine Information Sheet and instruction to access the V-Safe system.   Kristine King was instructed to call 911 with any severe reactions post vaccine: Marland Kitchen Difficulty breathing  . Swelling of face and throat  . A fast heartbeat  . A bad rash all over body  . Dizziness and weakness   Immunizations Administered    Name Date Dose VIS Date Route   Pfizer COVID-19 Vaccine 09/29/2019  6:55 PM 0.3 mL 07/30/2018 Intramuscular   Manufacturer: [employer], [employer]   Lot: [id number]   NDC: [address]-0

## 2020-03-01 ENCOUNTER — Encounter: Payer: Managed Care, Other (non HMO) | Admitting: Certified Nurse Midwife

## 2022-10-21 ENCOUNTER — Ambulatory Visit
Admission: EM | Admit: 2022-10-21 | Discharge: 2022-10-21 | Disposition: A | Discharge status: Home / Self Care | Payer: Managed Care, Other (non HMO) | Attending: Family Medicine | Admitting: Family Medicine

## 2022-10-21 DIAGNOSIS — N390 Urinary tract infection, site not specified: Secondary | ICD-10-CM | POA: Diagnosis present

## 2022-10-21 DIAGNOSIS — N760 Acute vaginitis: Secondary | ICD-10-CM | POA: Diagnosis present

## 2022-10-21 DIAGNOSIS — B9689 Other specified bacterial agents as the cause of diseases classified elsewhere: Secondary | ICD-10-CM | POA: Insufficient documentation

## 2022-10-21 LAB — URINALYSIS, W/ REFLEX TO CULTURE (INFECTION SUSPECTED)
Bilirubin Urine: NEGATIVE
Glucose, UA: NEGATIVE mg/dL
Ketones, ur: NEGATIVE mg/dL
Nitrite: POSITIVE — AB
Protein, ur: 100 mg/dL — AB
Specific Gravity, Urine: 1.025 (ref 1.005–1.030)
WBC, UA: >50 WBC/hpf (ref 0–5)
pH: 7 (ref 5.0–8.0)

## 2022-10-21 LAB — WET PREP, GENITAL
Sperm: NONE SEEN
Trich, Wet Prep: NONE SEEN
WBC, Wet Prep HPF POC: >10 — AB (ref <10)
Yeast Wet Prep HPF POC: NONE SEEN

## 2022-10-21 MED ORDER — PHENAZOPYRIDINE HCL 200 MG PO TABS: 200.0000 mg | ORAL_TABLET | Freq: Three times a day (TID) | ORAL | 0 refills | Status: DC

## 2022-10-21 MED ORDER — NITROFURANTOIN MONOHYD MACRO 100 MG PO CAPS: 100.0000 mg | ORAL_CAPSULE | Freq: Two times a day (BID) | ORAL | 0 refills | Status: DC

## 2022-10-21 MED ORDER — METRONIDAZOLE 500 MG PO TABS: 500.0000 mg | ORAL_TABLET | Freq: Two times a day (BID) | ORAL | 0 refills | Status: DC

## 2022-10-21 NOTE — ED Triage Notes (Addendum)
Patient presents with burning when urinating x day 5.

## 2022-10-21 NOTE — ED Provider Notes (Signed)
[facility]    CSN: [id number] Arrival date & time: 10/21/22  1004      History   Chief Complaint No chief complaint on file.   HPI Kristine King is a 32 y.o. female.   HPI  32 year old female with a past medical history of anemia and allergies presents for evaluation of burning with urination that has been going on for last 5 days and is associated with urgency and frequency of urination.  She denies any fever, nausea or vomiting, low back pain, or blood in her urine.  She also denies vaginal discharge or itching.  She has not been on antibiotics recently.  Her last menstrual cycle was 2 weeks ago.  Past Medical History:  Diagnosis Date   Allergy    Anemia     There are no problems to display for this patient.   Past Surgical History:  Procedure Laterality Date   NO PAST SURGERIES     none      OB History     Gravida  3   Para  3   Term  3   Preterm      AB      Living  3      SAB      IAB      Ectopic      Multiple  0   Live Births  3            Home Medications    Prior to Admission medications   Medication Sig Start Date End Date Taking? Authorizing Provider  metroNIDAZOLE (FLAGYL) 500 MG tablet Take 1 tablet (500 mg total) by mouth 2 (two) times daily. 10/21/22  Yes Becky Augusta, NP  nitrofurantoin, macrocrystal-monohydrate, (MACROBID) 100 MG capsule Take 1 capsule (100 mg total) by mouth 2 (two) times daily. 10/21/22  Yes Becky Augusta, NP  phenazopyridine (PYRIDIUM) 200 MG tablet Take 1 tablet (200 mg total) by mouth 3 (three) times daily. 10/21/22  Yes Becky Augusta, NP  ibuprofen (ADVIL) 600 MG tablet Take 1 tablet (600 mg total) by mouth every 6 (six) hours. Patient not taking: Reported on 08/26/2019 07/16/19   Doreene Burke, CNM  Iron-FA-B Cmp-C-Biot-Probiotic (FUSION PLUS) CAPS Take 1 tablet by mouth daily. 05/07/19   Doreene Burke, CNM  norethindrone (MICRONOR) 0.35 MG tablet Take 1 tablet (0.35 mg total) by mouth  daily. Start at 4 wks postpartum 07/16/19   Doreene Burke, CNM  Prenatal Vit-Fe Fumarate-FA (PRENATAL MULTIVITAMIN) TABS tablet Take 1 tablet by mouth daily at 12 noon.    [provider]    Family History Family History  Problem Relation Age of Onset   Diabetes Paternal Grandfather    Breast cancer Neg Hx    Ovarian cancer Neg Hx    Colon cancer Neg Hx     Social History Social History   Tobacco Use   Smoking status: Never   Smokeless tobacco: Never  Vaping Use   Vaping Use: Never used  Substance Use Topics   Alcohol use: No   Drug use: No     Allergies   Meloxicam and Penicillin g   Review of Systems Review of Systems  Constitutional:  Negative for fever.  Gastrointestinal:  Negative for abdominal pain, nausea and vomiting.  Genitourinary:  Positive for dysuria, frequency and urgency. Negative for hematuria, vaginal discharge and vaginal pain.  Musculoskeletal:  Negative for back pain.     Physical Exam Triage Vital Signs ED Triage Vitals  Enc Vitals  Group     BP 10/21/22 1052 107/69     Pulse Rate 10/21/22 1052 88     Resp 10/21/22 1052 16     Temp 10/21/22 1052 98.4 F (36.9 C)     Temp Source 10/21/22 1052 Oral     SpO2 10/21/22 1052 99 %     Weight 10/21/22 1054 140 lb (63.5 kg)     Height 10/21/22 1054 5\' 2"  (1.575 m)     Head Circumference --      Peak Flow --      Pain Score 10/21/22 1053 8     Pain Loc --      Pain Edu? --      Excl. in GC? --    No data found.  Updated Vital Signs BP 107/69 (BP Location: Right Arm)   Pulse 88   Temp 98.4 F (36.9 C) (Oral)   Resp 16   Ht 5\' 2"  (1.575 m)   Wt 140 lb (63.5 kg)   LMP 10/04/2022 (Approximate)   SpO2 99%   BMI 25.61 kg/m   Visual Acuity Right Eye Distance:   Left Eye Distance:   Bilateral Distance:    Right Eye Near:   Left Eye Near:    Bilateral Near:     Physical Exam Vitals and nursing note reviewed.  Constitutional:      Appearance: Normal appearance. She is  not ill-appearing.  HENT:     Head: Normocephalic and atraumatic.  Cardiovascular:     Rate and Rhythm: Normal rate and regular rhythm.     Pulses: Normal pulses.     Heart sounds: Normal heart sounds. No murmur heard.    No friction rub. No gallop.  Pulmonary:     Effort: Pulmonary effort is normal.     Breath sounds: Normal breath sounds. No wheezing, rhonchi or rales.  Abdominal:     Tenderness: There is no right CVA tenderness or left CVA tenderness.  Skin:    General: Skin is warm and dry.     Capillary Refill: Capillary refill takes less than 2 seconds.  Neurological:     General: No focal deficit present.     Mental Status: She is alert and oriented to person, place, and time.      UC Treatments / Results  Labs (all labs ordered are listed, but only abnormal results are displayed) Labs Reviewed  WET PREP, GENITAL - Abnormal; Notable for the following components:      Result Value   Clue Cells Wet Prep HPF POC PRESENT (*)    WBC, Wet Prep HPF POC >10 (*)    All other components within normal limits  URINALYSIS, W/ REFLEX TO CULTURE (INFECTION SUSPECTED) - Abnormal; Notable for the following components:   APPearance CLOUDY (*)    Hgb urine dipstick MODERATE (*)    Protein, ur 100 (*)    Nitrite POSITIVE (*)    Leukocytes,Ua LARGE (*)    Bacteria, UA MANY (*)    All other components within normal limits  URINE CULTURE    EKG   Radiology No results found.  Procedures Procedures (including critical care time)  Medications Ordered in UC Medications - No data to display  Initial Impression / Assessment and Plan / UC Course  I have reviewed the triage vital signs and the nursing notes.  Pertinent labs & imaging results that were available during my care of the patient were reviewed by me and considered in my medical decision  making (see chart for details).   Patient is a pleasant, nontoxic-appearing 32 year old female presenting for evaluation of 5 days  worth of UTI symptoms as outlined in HPI above.  She does not have a history of UTIs and states her last UTI was approximately 10 years ago.  She denies any vaginal discharge or itching and she has not been on antibiotics recently.  Her last menstrual cycle was 2 weeks ago.  Physical exam reveals a benign cardiopulmonary exam and no CVA tenderness on exam.  I will order urinalysis to look for presence of UTI as well as a vaginal swab to look for the presence of BV or yeast given that she has urinary symptoms and her last menstrual cycle was approximately 2 weeks ago.  Vaginal wet prep is positive for clue cells.  Urinalysis has a cloudy appearance with moderate hemoglobin, 100 protein, nitrite positive with large leukocyte esterase.  Reflex microscopy shows >50 WBCs, 6-10 RBC, many bacteria, and WBC clumps.  Urine will reflex to culture.  I will discharge the patient home with a diagnosis of UTI and bacterial vaginosis and I will treat her with a combination of Macrobid and Pyridium for the UTI.  The Macrobid will be 100 mg twice daily for 5 days.  Pyridium 200 mg every 8 hours as needed for urinary discomfort.  For the bacterial vaginosis I will treat her with metronidazole 500 mg twice daily for 7 days.   Final Clinical Impressions(s) / UC Diagnoses   Final diagnoses:  Acute lower UTI (urinary tract infection)  Bacterial vaginosis     Discharge Instructions      Take the Macrobid twice daily for 5 days with food for treatment of urinary tract infection.  Use the Pyridium every 8 hours as needed for urinary discomfort.  This will turn your urine a bright red-orange.  Increase your oral fluid intake so that you increase your urine production and or flushing your urinary system.  Take an over-the-counter probiotic, such as Culturelle-Align-Activia, 1 hour after each dose of antibiotic to prevent diarrhea or yeast infections from forming.  We will culture urine and change the antibiotics if  necessary.  Your vaginal swab also showed the presence of bacterial vaginosis.  This is a bacterial imbalance in your vaginal vault and will require a different antibiotic from the [facility] you are being given for your UTI.  Take the Flagyl (metronidazole) 500 mg twice daily for treatment of your bacterial vaginosis.  Avoid alcohol while on the metronidazole as taken together will cause of vomiting.  Bacterial vaginosis is often caused by a imbalance of bacteria in your vaginal vault.  This is sometimes a result of using tampons or hormonal fluctuations during her menstrual cycle.  You if your symptoms are recurrent you can try using a boric acid suppository twice weekly to help maintain the acid-base balance in your vagina vault which could prevent further infection.  You can also try vaginal probiotics to help return normal bacterial balance.   Return for reevaluation, or see your primary care provider, for any new or worsening symptoms.      ED Prescriptions     Medication Sig Dispense Auth. Provider   nitrofurantoin, macrocrystal-monohydrate, (MACROBID) 100 MG capsule Take 1 capsule (100 mg total) by mouth 2 (two) times daily. 10 capsule Becky Augusta, NP   phenazopyridine (PYRIDIUM) 200 MG tablet Take 1 tablet (200 mg total) by mouth 3 (three) times daily. 6 tablet Becky Augusta, NP   metroNIDAZOLE (FLAGYL) 500  MG tablet Take 1 tablet (500 mg total) by mouth 2 (two) times daily. 14 tablet Becky Augusta, NP      PDMP not reviewed this encounter.   Becky Augusta, NP 10/21/22 1149

## 2022-10-21 NOTE — Discharge Instructions (Addendum)
Take the Macrobid twice daily for 5 days with food for treatment of urinary tract infection.  Use the Pyridium every 8 hours as needed for urinary discomfort.  This will turn your urine a bright red-orange.  Increase your oral fluid intake so that you increase your urine production and or flushing your urinary system.  Take an over-the-counter probiotic, such as Culturelle-Align-Activia, 1 hour after each dose of antibiotic to prevent diarrhea or yeast infections from forming.  We will culture urine and change the antibiotics if necessary.  Your vaginal swab also showed the presence of bacterial vaginosis.  This is a bacterial imbalance in your vaginal vault and will require a different antibiotic from the [facility] you are being given for your UTI.  Take the Flagyl (metronidazole) 500 mg twice daily for treatment of your bacterial vaginosis.  Avoid alcohol while on the metronidazole as taken together will cause of vomiting.  Bacterial vaginosis is often caused by a imbalance of bacteria in your vaginal vault.  This is sometimes a result of using tampons or hormonal fluctuations during her menstrual cycle.  You if your symptoms are recurrent you can try using a boric acid suppository twice weekly to help maintain the acid-base balance in your vagina vault which could prevent further infection.  You can also try vaginal probiotics to help return normal bacterial balance.   Return for reevaluation, or see your primary care provider, for any new or worsening symptoms.

## 2022-10-23 LAB — URINE CULTURE: Culture: >=100000 — AB

## 2024-01-08 ENCOUNTER — Ambulatory Visit
Admission: RE | Admit: 2024-01-08 | Discharge: 2024-01-08 | Disposition: A | Discharge status: Home / Self Care | Source: Ambulatory Visit | Attending: Emergency Medicine | Admitting: Emergency Medicine

## 2024-01-08 VITALS — BP 107/70 | HR 58 | Temp 98.9°F | Resp 17

## 2024-01-08 DIAGNOSIS — N3001 Acute cystitis with hematuria: Secondary | ICD-10-CM

## 2024-01-08 LAB — URINALYSIS, W/ REFLEX TO CULTURE (INFECTION SUSPECTED)
Bilirubin Urine: NEGATIVE
Glucose, UA: NEGATIVE mg/dL
Ketones, ur: NEGATIVE mg/dL
Nitrite: NEGATIVE
Protein, ur: NEGATIVE mg/dL
Specific Gravity, Urine: 1.01 (ref 1.005–1.030)
pH: 5.5 (ref 5.0–8.0)

## 2024-01-08 MED ORDER — PHENAZOPYRIDINE HCL 200 MG PO TABS: 200.0000 mg | ORAL_TABLET | Freq: Three times a day (TID) | ORAL | 0 refills | Status: AC | PRN

## 2024-01-08 MED ORDER — NITROFURANTOIN MONOHYD MACRO 100 MG PO CAPS: 100.0000 mg | ORAL_CAPSULE | Freq: Two times a day (BID) | ORAL | 0 refills | Status: AC

## 2024-01-08 NOTE — ED Triage Notes (Addendum)
 Sx x 4 days  Dysuria Urinary frequency No concerns for std  Patient sttaes that she had a itchy rash on her righ arm that's going aeay

## 2024-01-08 NOTE — ED Provider Notes (Signed)
 HPI  SUBJECTIVE:  Kristine King is a 33 y.o. female who presents with 4 days of dysuria, urgency, frequency, cloudy urine.  States that she is urinating small amounts at a time.  No odorous urine, hematuria, nausea, vomiting, fevers, abdominal, back, pelvic pain.  No vaginal odor, bleeding, discharge or itching.  She is in a long-term monogamous relationship with a female, who is asymptomatic.  STDs are not a concern today.  No recent antibiotics.  No antipyretic in the past 6 hours.  She tried pushing fluids with improvement in her symptoms.  Symptoms are worse when she does not drink fluids. Last UTI was an E. coli UTI that was sensitive to Macrobid , cephalosporins, and Bactrim.  No history of pyelonephritis, nephrolithiasis, STDs, BV, yeast, diabetes.  LMP: Mid July.  Denies the possibility of being pregnant.  PCP: [facility]   Past Medical History:  Diagnosis Date   Allergy    Anemia     Past Surgical History:  Procedure Laterality Date   NO PAST SURGERIES     none      Family History  Problem Relation Age of Onset   Diabetes Paternal Grandfather    Breast cancer Neg Hx    Ovarian cancer Neg Hx    Colon cancer Neg Hx     Social History   Tobacco Use   Smoking status: Never   Smokeless tobacco: Never  Vaping Use   Vaping status: Never Used  Substance Use Topics   Alcohol use: No   Drug use: No    No current facility-administered medications for this encounter.  Current Outpatient Medications:    nitrofurantoin , macrocrystal-monohydrate, (MACROBID ) 100 MG capsule, Take 1 capsule (100 mg total) by mouth 2 (two) times daily for 5 days., Disp: 10 capsule, Rfl: 0   phenazopyridine  (PYRIDIUM ) 200 MG tablet, Take 1 tablet (200 mg total) by mouth 3 (three) times daily as needed for pain., Disp: 6 tablet, Rfl: 0   Iron-FA-B Cmp-C-Biot-Probiotic (FUSION PLUS) CAPS, Take 1 tablet by mouth daily., Disp: 30 capsule, Rfl: 4   norethindrone  (MICRONOR ) 0.35 MG tablet, Take 1  tablet (0.35 mg total) by mouth daily. Start at 4 wks postpartum, Disp: 1 Package, Rfl: 11   Prenatal Vit-Fe Fumarate-FA (PRENATAL MULTIVITAMIN) TABS tablet, Take 1 tablet by mouth daily at 12 noon., Disp: , Rfl:   Allergies  Allergen Reactions   Meloxicam Swelling   Penicillin G Hives     ROS  As noted in HPI.   Physical Exam  BP 107/70 (BP Location: Left Arm)   Pulse (!) 58   Temp 98.9 F (37.2 C) (Oral)   Resp 17   LMP 12/20/2023   SpO2 100%   Constitutional: Well developed, well nourished, no acute distress Eyes:  EOMI, conjunctiva normal bilaterally HENT: Normocephalic, atraumatic,mucus membranes moist Respiratory: Normal inspiratory effort Cardiovascular: Normal rate GI: nondistended.  Soft.  Positive suprapubic.  No flank tenderness. Back: No CVAT Musculoskeletal: no deformities Neurologic: Alert & oriented x 3, no focal neuro deficits Psychiatric: Speech and behavior appropriate   ED Course   Medications - No data to display  Orders Placed This Encounter  Procedures   Urine Culture    Standing Status:   Standing    Number of Occurrences:   1   Urinalysis, w/ Reflex to Culture (Infection Suspected) -Urine, Clean Catch    Standing Status:   Standing    Number of Occurrences:   1    Specimen Source:   Urine,  Clean Catch [[id number]]    Release to patient:   Immediate    Results for orders placed or performed during the hospital encounter of 01/08/24 (from the past 24 hours)  Urinalysis, w/ Reflex to Culture (Infection Suspected) -Urine, Clean Catch     Status: Abnormal   Collection Time: 01/08/24  7:04 PM  Result Value Ref Range   Specimen Source URINE, CLEAN CATCH    Color, Urine YELLOW YELLOW   APPearance HAZY (A) CLEAR   Specific Gravity, Urine 1.010 1.005 - 1.030   pH 5.5 5.0 - 8.0   Glucose, UA NEGATIVE NEGATIVE mg/dL   Hgb urine dipstick SMALL (A) NEGATIVE   Bilirubin Urine NEGATIVE NEGATIVE   Ketones, ur NEGATIVE NEGATIVE mg/dL   Protein, ur  NEGATIVE NEGATIVE mg/dL   Nitrite NEGATIVE NEGATIVE   Leukocytes,Ua MODERATE (A) NEGATIVE   Squamous Epithelial / HPF 0-5 0 - 5 /HPF   WBC, UA 21-50 0 - 5 WBC/hpf   RBC / HPF 6-10 0 - 5 RBC/hpf   Bacteria, UA MANY (A) NONE SEEN   No results found.  ED Clinical Impression  1. Acute cystitis with hematuria      ED Assessment/Plan    Previous records reviewed.  As noted in HPI.  UA consistent with a UT with many bacteria, moderate leukocytes, pyuria, small hematuria.  Sent urine culture to confirm antibiotic choice.  Home with Pyridium , Macrobid , increase fluids.  She has follow-up scheduled with her PCP next week.  ER return precautions given.  Discussed labs,  MDM, treatment plan, and plan for follow-up with patient. Discussed sn/sx that should prompt return to the ED. patient agrees with plan.   Meds ordered this encounter  Medications   nitrofurantoin , macrocrystal-monohydrate, (MACROBID ) 100 MG capsule    Sig: Take 1 capsule (100 mg total) by mouth 2 (two) times daily for 5 days.    Dispense:  10 capsule    Refill:  0   phenazopyridine  (PYRIDIUM ) 200 MG tablet    Sig: Take 1 tablet (200 mg total) by mouth 3 (three) times daily as needed for pain.    Dispense:  6 tablet    Refill:  0      *This clinic note was created using Scientist, clinical (histocompatibility and immunogenetics). Therefore, there may be occasional mistakes despite careful proofreading.  ?    Van Knee, MD 01/08/24 1930

## 2024-01-08 NOTE — Discharge Instructions (Signed)
 We have sent your urine off for culture to make sure that we have you on the correct antibiotic.  Continue drinking plenty of extra fluids I will prevent her from going to your kidneys.  Pyridium  will turn your urine orange, but will help with your urinary symptoms.  Follow-up with your primary care provider as scheduled.  Go to the ER for the signs and symptoms we discussed

## 2024-01-10 ENCOUNTER — Ambulatory Visit: Payer: Self-pay

## 2024-01-10 LAB — URINE CULTURE: Culture: >=100000 — AB

## 2024-03-07 ENCOUNTER — Other Ambulatory Visit: Payer: Self-pay | Admitting: Family Medicine

## 2024-03-07 DIAGNOSIS — N6459 Other signs and symptoms in breast: Secondary | ICD-10-CM

## 2024-03-13 ENCOUNTER — Ambulatory Visit
Admission: RE | Admit: 2024-03-13 | Discharge: 2024-03-13 | Disposition: A | Discharge status: Home / Self Care | Source: Ambulatory Visit | Attending: Family Medicine | Admitting: Family Medicine

## 2024-03-13 DIAGNOSIS — N6459 Other signs and symptoms in breast: Secondary | ICD-10-CM | POA: Diagnosis present

## 2024-05-09 ENCOUNTER — Ambulatory Visit
Admission: RE | Admit: 2024-05-09 | Discharge: 2024-05-09 | Disposition: A | Discharge status: Home / Self Care | Source: Ambulatory Visit | Attending: Emergency Medicine | Admitting: Emergency Medicine

## 2024-05-09 VITALS — BP 101/68 | HR 71 | Temp 98.5°F | Resp 14 | Ht 62.0 in | Wt 140.0 lb

## 2024-05-09 DIAGNOSIS — J22 Unspecified acute lower respiratory infection: Secondary | ICD-10-CM | POA: Diagnosis not present

## 2024-05-09 DIAGNOSIS — R051 Acute cough: Secondary | ICD-10-CM | POA: Diagnosis not present

## 2024-05-09 MED ORDER — PROMETHAZINE-DM 6.25-15 MG/5ML PO SYRP: 5.0000 mL | ORAL_SOLUTION | Freq: Four times a day (QID) | ORAL | 0 refills | Status: AC | PRN

## 2024-05-09 MED ORDER — DOXYCYCLINE HYCLATE 100 MG PO CAPS: 100.0000 mg | ORAL_CAPSULE | Freq: Two times a day (BID) | ORAL | 0 refills | Status: AC

## 2024-05-09 NOTE — ED Triage Notes (Signed)
 Patient c/o runny nose, cough, and chest congestion that started last week.  Patient denies fevers.

## 2024-05-09 NOTE — Discharge Instructions (Addendum)
 Take doxycycline  as directed for your respiratory infection.  Drink plenty of fluids, take Phenergan  DM cough medicine as prescribed, drowsiness precautions.  Follow-up with your PCP.  If you have new or worsening symptoms(chest pain, shortness of breath, etc) go to emergency room for further evaluation.

## 2024-05-09 NOTE — ED Provider Notes (Signed)
 [facility]    CSN: [id number] Arrival date & time: 05/09/24  0859      History   Chief Complaint Chief Complaint  Patient presents with   Cough    Entered by patient    HPI Kristine King is a 33 y.o. female.   33 year old female, Kristine King, presents to urgent care for evaluation of persistent cough x 10 days. Pt has taken Mucinex,Nyquil,theraflu without relief. 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   The history is provided by the patient. No language interpreter was used.    Past Medical History:  Diagnosis Date   Allergy    Anemia     Patient Active Problem List   Diagnosis Date Noted   Acute respiratory infection 05/09/2024   Acute cough 05/09/2024    Past Surgical History:  Procedure Laterality Date   NO PAST SURGERIES     none      OB History     Gravida  3   Para  3   Term  3   Preterm      AB      Living  3      SAB      IAB      Ectopic      Multiple  0   Live Births  3            Home Medications    Prior to Admission medications   Medication Sig Start Date End Date Taking? Authorizing Provider  doxycycline  (VIBRAMYCIN ) 100 MG capsule Take 1 capsule (100 mg total) by mouth 2 (two) times daily for 7 days. 05/09/24 05/16/24 Yes Karimah Winquist, NP  promethazine -dextromethorphan (PROMETHAZINE -DM) 6.25-15 MG/5ML syrup Take 5 mLs by mouth 4 (four) times daily as needed for cough. 05/09/24  Yes Clifton Kovacic, Rilla, NP  Iron-FA-B Cmp-C-Biot-Probiotic (FUSION PLUS) CAPS Take 1 tablet by mouth daily. 05/07/19   Sebastian Sham, CNM  norethindrone  (MICRONOR ) 0.35 MG tablet Take 1 tablet (0.35 mg total) by mouth daily. Start at 4 wks postpartum 07/16/19   Sebastian Sham, CNM  phenazopyridine  (PYRIDIUM ) 200 MG tablet Take 1 tablet (200 mg total) by mouth 3 (three) times daily as needed for pain. 01/08/24   Van Knee, MD  Prenatal Vit-Fe Fumarate-FA (PRENATAL MULTIVITAMIN) TABS tablet Take 1 tablet by  mouth daily at 12 noon.    [provider]    Family History Family History  Problem Relation Age of Onset   Diabetes Paternal Grandfather    Breast cancer Neg Hx    Ovarian cancer Neg Hx    Colon cancer Neg Hx     Social History Social History   Tobacco Use   Smoking status: Never   Smokeless tobacco: Never  Vaping Use   Vaping status: Never Used  Substance Use Topics   Alcohol use: No   Drug use: No     Allergies   Meloxicam and Penicillin g   Review of Systems Review of Systems  Constitutional:  Negative for chills and fever.  HENT:  Positive for congestion and rhinorrhea. Negative for ear pain.   Eyes: Negative.   Respiratory:  Positive for cough.   Gastrointestinal:  Negative for nausea and vomiting.  Endocrine: Negative.   Genitourinary:  Negative for dysuria.  Musculoskeletal:  Negative for myalgias.  Skin:  Negative for rash.  Allergic/Immunologic: Negative.   Neurological:  Negative for headaches.  Hematological: Negative.   Psychiatric/Behavioral: Negative.    All other systems reviewed  and are negative.    Physical Exam Triage Vital Signs ED Triage Vitals  Encounter Vitals Group     BP 05/09/24 0923 101/68     Girls Systolic BP Percentile --      Girls Diastolic BP Percentile --      Boys Systolic BP Percentile --      Boys Diastolic BP Percentile --      Pulse Rate 05/09/24 0923 71     Resp 05/09/24 0923 14     Temp 05/09/24 0923 98.5 F (36.9 C)     Temp Source 05/09/24 0923 Oral     SpO2 05/09/24 0923 99 %     Weight 05/09/24 0921 140 lb (63.5 kg)     Height 05/09/24 0921 5' 2 (1.575 m)     Head Circumference --      Peak Flow --      Pain Score 05/09/24 0921 0     Pain Loc --      Pain Education --      Exclude from Growth Chart --    No data found.  Updated Vital Signs BP 101/68 (BP Location: Right Arm)   Pulse 71   Temp 98.5 F (36.9 C) (Oral)   Resp 14   Ht 5' 2 (1.575 m)   Wt 140 lb (63.5 kg)   LMP  05/08/2024 (Exact Date)   SpO2 99%   Breastfeeding No   BMI 25.61 kg/m   Visual Acuity Right Eye Distance:   Left Eye Distance:   Bilateral Distance:    Right Eye Near:   Left Eye Near:    Bilateral Near:     Physical Exam Vitals and nursing note reviewed.  Constitutional:      General: She is not in acute distress.    Appearance: She is well-developed and well-groomed.  HENT:     Head: Normocephalic.     Right Ear: Tympanic membrane is retracted.     Left Ear: Tympanic membrane is retracted.     Nose: Congestion present.     Mouth/Throat:     Lips: Pink.     Mouth: Mucous membranes are moist.     Pharynx: Oropharynx is clear.  Eyes:     General: Lids are normal.     Conjunctiva/sclera: Conjunctivae normal.     Pupils: Pupils are equal, round, and reactive to light.  Neck:     Trachea: No tracheal deviation.  Cardiovascular:     Rate and Rhythm: Normal rate and regular rhythm.     Heart sounds: Normal heart sounds. No murmur heard. Pulmonary:     Effort: Pulmonary effort is normal.     Breath sounds: Normal breath sounds and air entry.  Abdominal:     General: Bowel sounds are normal.     Palpations: Abdomen is soft.     Tenderness: There is no abdominal tenderness.  Musculoskeletal:        General: Normal range of motion.     Cervical back: Normal range of motion.  Lymphadenopathy:     Cervical: No cervical adenopathy.  Skin:    General: Skin is warm and dry.     Findings: No rash.  Neurological:     General: No focal deficit present.     Mental Status: She is alert and oriented to person, place, and time.     GCS: GCS eye subscore is 4. GCS verbal subscore is 5. GCS motor subscore is 6.  Psychiatric:  Attention and Perception: Attention normal.        Mood and Affect: Mood normal.        Speech: Speech normal.        Behavior: Behavior normal. Behavior is cooperative.      UC Treatments / Results  Labs (all labs ordered are listed, but only  abnormal results are displayed) Labs Reviewed - No data to display  EKG   Radiology No results found.  Procedures Procedures (including critical care time)  Medications Ordered in UC Medications - No data to display  Initial Impression / Assessment and Plan / UC Course  I have reviewed the triage vital signs and the nursing notes.  Pertinent labs & imaging results that were available during my care of the patient were reviewed by me and considered in my medical decision making (see chart for details).    Discussed exam findings and plan of care with patient, doxycycline , phenergan  DM, strict go to ER precautions given.   Patient verbalized understanding to this provider.  Ddx: Acute respiratory infection, acute cough, viral illness,allergies Final Clinical Impressions(s) / UC Diagnoses   Final diagnoses:  Acute respiratory infection  Acute cough     Discharge Instructions      Take doxycycline  as directed for your respiratory infection.  Drink plenty of fluids, take Phenergan  DM cough medicine as prescribed, drowsiness precautions.  Follow-up with your PCP.  If you have new or worsening symptoms(chest pain, shortness of breath, etc) go to emergency room for further evaluation.     ED Prescriptions     Medication Sig Dispense Auth. Provider   doxycycline  (VIBRAMYCIN ) 100 MG capsule Take 1 capsule (100 mg total) by mouth 2 (two) times daily for 7 days. 14 capsule Quinta Eimer, NP   promethazine -dextromethorphan (PROMETHAZINE -DM) 6.25-15 MG/5ML syrup Take 5 mLs by mouth 4 (four) times daily as needed for cough. 118 mL Bandy Honaker, NP      PDMP not reviewed this encounter.   Aminta Loose, NP 05/09/24 1527

## 2024-06-06 ENCOUNTER — Other Ambulatory Visit: Payer: Self-pay | Admitting: Family Medicine

## 2024-06-06 DIAGNOSIS — N6459 Other signs and symptoms in breast: Secondary | ICD-10-CM

## 2024-06-06 DIAGNOSIS — R928 Other abnormal and inconclusive findings on diagnostic imaging of breast: Secondary | ICD-10-CM

## 2024-06-13 ENCOUNTER — Ambulatory Visit
Admission: RE | Admit: 2024-06-13 | Discharge: 2024-06-13 | Disposition: A | Source: Ambulatory Visit | Attending: Family Medicine | Admitting: Family Medicine

## 2024-06-13 DIAGNOSIS — N6459 Other signs and symptoms in breast: Secondary | ICD-10-CM | POA: Diagnosis present

## 2024-06-13 DIAGNOSIS — R928 Other abnormal and inconclusive findings on diagnostic imaging of breast: Secondary | ICD-10-CM | POA: Insufficient documentation

## 2024-06-13 MED ORDER — GADOBUTROL 1 MMOL/ML IV SOLN
6.0000 mL | Freq: Once | INTRAVENOUS | Status: AC | PRN
  Administered 2024-06-13: 6 mL via INTRAVENOUS
